# Patient Record
Sex: Female | Born: 1965 | Race: White | Hispanic: No | Marital: Married | Smoking: Former smoker
Health system: Southern US, Community
[De-identification: ages and names within clinical notes are randomized; demographics above are authoritative.]

## PROBLEM LIST (undated history)

## (undated) DIAGNOSIS — E785 Hyperlipidemia, unspecified: Secondary | ICD-10-CM

## (undated) DIAGNOSIS — F32A Depression, unspecified: Secondary | ICD-10-CM

## (undated) DIAGNOSIS — E119 Type 2 diabetes mellitus without complications: Secondary | ICD-10-CM

## (undated) DIAGNOSIS — F329 Major depressive disorder, single episode, unspecified: Secondary | ICD-10-CM

## (undated) HISTORY — DX: Depression, unspecified: F32.A

## (undated) HISTORY — DX: Hyperlipidemia, unspecified: E78.5

## (undated) HISTORY — DX: Major depressive disorder, single episode, unspecified: F32.9

---

## 1989-02-09 HISTORY — PX: OOPHORECTOMY: SHX86

## 1990-01-09 HISTORY — PX: LAPAROSCOPY: SHX197

## 1996-12-10 HISTORY — PX: TUBAL LIGATION: SHX77

## 2001-04-22 ENCOUNTER — Ambulatory Visit (HOSPITAL_COMMUNITY): Admission: RE | Admit: 2001-04-22 | Discharge: 2001-04-22 | Payer: Self-pay | Admitting: Family Medicine

## 2001-04-22 ENCOUNTER — Encounter: Payer: Self-pay | Admitting: Family Medicine

## 2006-03-01 ENCOUNTER — Other Ambulatory Visit: Admission: RE | Admit: 2006-03-01 | Discharge: 2006-03-01 | Payer: Self-pay | Admitting: Gynecology

## 2006-03-05 ENCOUNTER — Ambulatory Visit (HOSPITAL_COMMUNITY): Admission: RE | Admit: 2006-03-05 | Discharge: 2006-03-05 | Payer: Self-pay | Admitting: Family Medicine

## 2006-03-19 ENCOUNTER — Ambulatory Visit (HOSPITAL_COMMUNITY): Admission: RE | Admit: 2006-03-19 | Discharge: 2006-03-19 | Payer: Self-pay | Admitting: Gynecology

## 2006-11-23 ENCOUNTER — Other Ambulatory Visit: Admission: RE | Admit: 2006-11-23 | Discharge: 2006-11-23 | Payer: Self-pay | Admitting: Gynecology

## 2007-04-21 ENCOUNTER — Other Ambulatory Visit: Admission: RE | Admit: 2007-04-21 | Discharge: 2007-04-21 | Payer: Self-pay | Admitting: Gynecology

## 2007-05-26 ENCOUNTER — Encounter: Admission: RE | Admit: 2007-05-26 | Discharge: 2007-05-26 | Payer: Self-pay | Admitting: Gynecology

## 2007-05-31 ENCOUNTER — Ambulatory Visit (HOSPITAL_COMMUNITY): Admission: RE | Admit: 2007-05-31 | Discharge: 2007-05-31 | Payer: Self-pay | Admitting: Family Medicine

## 2007-06-10 HISTORY — PX: VULVA SURGERY: SHX837

## 2011-06-26 ENCOUNTER — Other Ambulatory Visit: Payer: Self-pay | Admitting: Gynecology

## 2011-06-26 DIAGNOSIS — R928 Other abnormal and inconclusive findings on diagnostic imaging of breast: Secondary | ICD-10-CM

## 2011-07-03 ENCOUNTER — Ambulatory Visit
Admission: RE | Admit: 2011-07-03 | Discharge: 2011-07-03 | Disposition: A | Discharge status: Home / Self Care | Payer: 59 | Source: Ambulatory Visit | Attending: Gynecology | Admitting: Gynecology

## 2011-07-03 ENCOUNTER — Other Ambulatory Visit: Payer: Self-pay | Admitting: Gynecology

## 2011-07-03 DIAGNOSIS — R928 Other abnormal and inconclusive findings on diagnostic imaging of breast: Secondary | ICD-10-CM

## 2011-07-03 DIAGNOSIS — N63 Unspecified lump in unspecified breast: Secondary | ICD-10-CM

## 2011-07-07 ENCOUNTER — Ambulatory Visit
Admission: RE | Admit: 2011-07-07 | Discharge: 2011-07-07 | Disposition: A | Discharge status: Home / Self Care | Payer: 59 | Source: Ambulatory Visit | Attending: Gynecology | Admitting: Gynecology

## 2011-07-07 ENCOUNTER — Other Ambulatory Visit: Payer: Self-pay | Admitting: Gynecology

## 2011-07-07 DIAGNOSIS — N63 Unspecified lump in unspecified breast: Secondary | ICD-10-CM

## 2013-05-11 ENCOUNTER — Other Ambulatory Visit: Payer: Self-pay | Admitting: Obstetrics and Gynecology

## 2013-05-16 ENCOUNTER — Other Ambulatory Visit (HOSPITAL_COMMUNITY)
Admission: RE | Admit: 2013-05-16 | Discharge: 2013-05-16 | Disposition: A | Discharge status: Home / Self Care | Payer: 59 | Source: Ambulatory Visit | Attending: Obstetrics and Gynecology | Admitting: Obstetrics and Gynecology

## 2013-05-16 ENCOUNTER — Encounter: Payer: Self-pay | Admitting: Obstetrics and Gynecology

## 2013-05-16 ENCOUNTER — Ambulatory Visit (INDEPENDENT_AMBULATORY_CARE_PROVIDER_SITE_OTHER): Payer: 59 | Admitting: Obstetrics and Gynecology

## 2013-05-16 VITALS — BP 132/78 | Ht 60.2 in | Wt 122.8 lb

## 2013-05-16 DIAGNOSIS — D071 Carcinoma in situ of vulva: Secondary | ICD-10-CM | POA: Insufficient documentation

## 2013-05-16 DIAGNOSIS — Z1212 Encounter for screening for malignant neoplasm of rectum: Secondary | ICD-10-CM

## 2013-05-16 DIAGNOSIS — Z1151 Encounter for screening for human papillomavirus (HPV): Secondary | ICD-10-CM | POA: Insufficient documentation

## 2013-05-16 DIAGNOSIS — Z01419 Encounter for gynecological examination (general) (routine) without abnormal findings: Secondary | ICD-10-CM | POA: Insufficient documentation

## 2013-05-16 DIAGNOSIS — N926 Irregular menstruation, unspecified: Secondary | ICD-10-CM

## 2013-05-16 LAB — HEMOCCULT GUIAC POC 1CARD (OFFICE): Fecal Occult Blood, POC: NEGATIVE

## 2013-05-16 NOTE — Patient Instructions (Signed)
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

## 2013-05-17 NOTE — Progress Notes (Signed)
Patient ID: Crystal Jensen, female   DOB: 05-22-1965, 48 y.o.   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 Pt to take photos of vulva for personal records Plan:  1. pap smear done, next pap due 1 yr   2. return annually or prn consider colposcopy vulva at return 3. Topical Mycolog for current minor irritation 3    Annual mammogram advised Subjective:  Crystal Jensen is a 48 y.o. female No obstetric history on file. who presents for annual exam. Patient's last menstrual period was 05/01/2013. 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  Review of Systems Constitutional: positive for none Gastrointestinal: negative Genitourinary: Hx VIN, unsure of severity, treated, now feels normal and no lesions felt by pt   Objective:  BP 132/78  Ht 5' 0.2" (1.529 m)  Wt 122 lb 12.8 oz (55.702 kg)  BMI 23.83 kg/m2  LMP 05/01/2013   BMI: Body mass index is 23.83 kg/(m^2).  General Appearance: Alert, appropriate appearance for age. No acute distress HEENT: Grossly normal Neck / Thyroid:  Cardiovascular: RRR; normal S1, S2, no murmur Lungs: CTA bilaterally Back: No CVAT Breast Exam: No dimpling, nipple retraction or discharge. No masses or nodes. and No masses or nodes.No dimpling, nipple retraction or discharge. Gastrointestinal: Soft, non-tender, no masses or organomegaly Pelvic Exam: External genitalia: normal general appearance and variable pigmentation of post fourchette, no hyperkeratosis , no plaque, Pt agrees to begin tokeep personal photos, declines photo in EPIC at this time (  not secure ), Vaginal: normal mucosa without prolapse or lesions Cervix: normal appearance Adnexa: normal bimanual exam Uterus: normal single, nontender Rectal: good sphincter tone and guaiac negative Rectovaginal: not indicated and normal rectal, no masses Lymphatic Exam: Non-palpable nodes in neck, clavicular, axillary, or inguinal regions Skin: no rash or abnormalities Neurologic: Normal gait and speech, no tremor  Psychiatric: Alert and oriented, appropriate affect.  Urinalysis:Not done  Christin BachJohn Evoleht Hovatter. MD Pgr [phone] 6:09 AM

## 2014-08-21 ENCOUNTER — Other Ambulatory Visit (HOSPITAL_COMMUNITY): Payer: Self-pay | Admitting: Family Medicine

## 2014-08-21 DIAGNOSIS — M5412 Radiculopathy, cervical region: Secondary | ICD-10-CM

## 2014-08-31 ENCOUNTER — Other Ambulatory Visit (HOSPITAL_COMMUNITY): Payer: Self-pay

## 2016-04-04 ENCOUNTER — Encounter (HOSPITAL_COMMUNITY): Payer: Self-pay

## 2016-04-04 ENCOUNTER — Inpatient Hospital Stay (HOSPITAL_COMMUNITY): Payer: 59 | Admitting: Anesthesiology

## 2016-04-04 ENCOUNTER — Inpatient Hospital Stay
Admission: EM | Admit: 2016-04-04 | Payer: Self-pay | Source: Other Acute Inpatient Hospital | Admitting: Neurological Surgery

## 2016-04-04 ENCOUNTER — Inpatient Hospital Stay (HOSPITAL_COMMUNITY): Payer: 59

## 2016-04-04 ENCOUNTER — Emergency Department (HOSPITAL_COMMUNITY): Payer: 59

## 2016-04-04 ENCOUNTER — Encounter (HOSPITAL_COMMUNITY): Admission: EM | Disposition: E | Payer: Self-pay | Source: Home / Self Care | Attending: Neurological Surgery

## 2016-04-04 ENCOUNTER — Inpatient Hospital Stay (HOSPITAL_COMMUNITY)
Admission: EM | Admit: 2016-04-04 | Discharge: 2016-04-09 | DRG: 023 | Disposition: E | Payer: 59 | Attending: Neurological Surgery | Admitting: Neurological Surgery

## 2016-04-04 DIAGNOSIS — Z882 Allergy status to sulfonamides status: Secondary | ICD-10-CM

## 2016-04-04 DIAGNOSIS — E876 Hypokalemia: Secondary | ICD-10-CM | POA: Diagnosis not present

## 2016-04-04 DIAGNOSIS — Z87891 Personal history of nicotine dependence: Secondary | ICD-10-CM | POA: Diagnosis not present

## 2016-04-04 DIAGNOSIS — J9601 Acute respiratory failure with hypoxia: Secondary | ICD-10-CM

## 2016-04-04 DIAGNOSIS — Z6825 Body mass index (BMI) 25.0-25.9, adult: Secondary | ICD-10-CM

## 2016-04-04 DIAGNOSIS — I619 Nontraumatic intracerebral hemorrhage, unspecified: Secondary | ICD-10-CM

## 2016-04-04 DIAGNOSIS — Z515 Encounter for palliative care: Secondary | ICD-10-CM | POA: Diagnosis not present

## 2016-04-04 DIAGNOSIS — R001 Bradycardia, unspecified: Secondary | ICD-10-CM | POA: Diagnosis not present

## 2016-04-04 DIAGNOSIS — J9600 Acute respiratory failure, unspecified whether with hypoxia or hypercapnia: Secondary | ICD-10-CM | POA: Diagnosis not present

## 2016-04-04 DIAGNOSIS — R4020 Unspecified coma: Secondary | ICD-10-CM | POA: Diagnosis not present

## 2016-04-04 DIAGNOSIS — I10 Essential (primary) hypertension: Secondary | ICD-10-CM | POA: Diagnosis present

## 2016-04-04 DIAGNOSIS — J690 Pneumonitis due to inhalation of food and vomit: Secondary | ICD-10-CM | POA: Diagnosis not present

## 2016-04-04 DIAGNOSIS — I615 Nontraumatic intracerebral hemorrhage, intraventricular: Secondary | ICD-10-CM | POA: Diagnosis present

## 2016-04-04 DIAGNOSIS — F329 Major depressive disorder, single episode, unspecified: Secondary | ICD-10-CM | POA: Diagnosis present

## 2016-04-04 DIAGNOSIS — Z4659 Encounter for fitting and adjustment of other gastrointestinal appliance and device: Secondary | ICD-10-CM

## 2016-04-04 DIAGNOSIS — E785 Hyperlipidemia, unspecified: Secondary | ICD-10-CM | POA: Diagnosis present

## 2016-04-04 DIAGNOSIS — E669 Obesity, unspecified: Secondary | ICD-10-CM | POA: Diagnosis present

## 2016-04-04 DIAGNOSIS — G935 Compression of brain: Secondary | ICD-10-CM | POA: Diagnosis present

## 2016-04-04 DIAGNOSIS — E1165 Type 2 diabetes mellitus with hyperglycemia: Secondary | ICD-10-CM | POA: Diagnosis present

## 2016-04-04 DIAGNOSIS — Z01818 Encounter for other preprocedural examination: Secondary | ICD-10-CM

## 2016-04-04 DIAGNOSIS — I614 Nontraumatic intracerebral hemorrhage in cerebellum: Secondary | ICD-10-CM | POA: Diagnosis not present

## 2016-04-04 DIAGNOSIS — I959 Hypotension, unspecified: Secondary | ICD-10-CM | POA: Diagnosis not present

## 2016-04-04 DIAGNOSIS — Z66 Do not resuscitate: Secondary | ICD-10-CM | POA: Diagnosis not present

## 2016-04-04 DIAGNOSIS — I616 Nontraumatic intracerebral hemorrhage, multiple localized: Secondary | ICD-10-CM | POA: Diagnosis present

## 2016-04-04 DIAGNOSIS — Z978 Presence of other specified devices: Secondary | ICD-10-CM

## 2016-04-04 DIAGNOSIS — I639 Cerebral infarction, unspecified: Secondary | ICD-10-CM | POA: Diagnosis present

## 2016-04-04 DIAGNOSIS — R51 Headache: Secondary | ICD-10-CM | POA: Diagnosis present

## 2016-04-04 HISTORY — PX: CRANIOTOMY: SHX93

## 2016-04-04 HISTORY — DX: Type 2 diabetes mellitus without complications: E11.9

## 2016-04-04 LAB — BLOOD GAS, ARTERIAL
ACID-BASE DEFICIT: 6.5 mmol/L — AB (ref 0.0–2.0)
BICARBONATE: 19.1 mmol/L — AB (ref 20.0–28.0)
Drawn by: 24485
FIO2: 100
MECHVT: 420 mL
O2 Saturation: 99.2 %
PATIENT TEMPERATURE: 96.8
PEEP/CPAP: 5 cmH2O
PH ART: 7.289 — AB (ref 7.350–7.450)
PO2 ART: 310 mmHg — AB (ref 83.0–108.0)
RATE: 16 resp/min
pCO2 arterial: 40.5 mmHg (ref 32.0–48.0)

## 2016-04-04 LAB — POCT I-STAT 7, (LYTES, BLD GAS, ICA,H+H)
ACID-BASE DEFICIT: 8 mmol/L — AB (ref 0.0–2.0)
Acid-base deficit: 5 mmol/L — ABNORMAL HIGH (ref 0.0–2.0)
Bicarbonate: 20.2 mmol/L (ref 20.0–28.0)
Bicarbonate: 16.4 mmol/L — ABNORMAL LOW (ref 20.0–28.0)
Calcium, Ion: 1.06 mmol/L — ABNORMAL LOW (ref 1.15–1.40)
Calcium, Ion: 1.04 mmol/L — ABNORMAL LOW (ref 1.15–1.40)
HEMATOCRIT: 24 % — AB (ref 36.0–46.0)
HEMATOCRIT: 29 % — AB (ref 36.0–46.0)
HEMOGLOBIN: 9.9 g/dL — AB (ref 12.0–15.0)
Hemoglobin: 8.2 g/dL — ABNORMAL LOW (ref 12.0–15.0)
O2 SAT: 100 %
O2 Saturation: 100 %
PCO2 ART: 35 mmHg (ref 32.0–48.0)
PO2 ART: 467 mmHg — AB (ref 83.0–108.0)
POTASSIUM: 3.7 mmol/L (ref 3.5–5.1)
POTASSIUM: 4.1 mmol/L (ref 3.5–5.1)
Sodium: 144 mmol/L (ref 135–145)
Sodium: 142 mmol/L (ref 135–145)
TCO2: 21 mmol/L (ref 0–100)
TCO2: 17 mmol/L (ref 0–100)
pCO2 arterial: 29.6 mmHg — ABNORMAL LOW (ref 32.0–48.0)
pH, Arterial: 7.369 (ref 7.350–7.450)
pH, Arterial: 7.352 (ref 7.350–7.450)
pO2, Arterial: 245 mmHg — ABNORMAL HIGH (ref 83.0–108.0)

## 2016-04-04 LAB — URINALYSIS, ROUTINE W REFLEX MICROSCOPIC
BACTERIA UA: NONE SEEN
Bilirubin Urine: NEGATIVE
Glucose, UA: NEGATIVE mg/dL
Ketones, ur: NEGATIVE mg/dL
Leukocytes, UA: NEGATIVE
Nitrite: NEGATIVE
PROTEIN: 30 mg/dL — AB
Specific Gravity, Urine: 1.015 (ref 1.005–1.030)
pH: 7 (ref 5.0–8.0)

## 2016-04-04 LAB — DIFFERENTIAL
BASOS PCT: 0 %
Basophils Absolute: 0.1 10*3/uL (ref 0.0–0.1)
EOS PCT: 1 %
Eosinophils Absolute: 0.1 10*3/uL (ref 0.0–0.7)
Lymphocytes Relative: 32 %
Lymphs Abs: 4.6 10*3/uL — ABNORMAL HIGH (ref 0.7–4.0)
MONO ABS: 1.2 10*3/uL — AB (ref 0.1–1.0)
Monocytes Relative: 8 %
NEUTROS PCT: 59 %
Neutro Abs: 8.2 10*3/uL — ABNORMAL HIGH (ref 1.7–7.7)

## 2016-04-04 LAB — I-STAT CHEM 8, ED
BUN: 9 mg/dL (ref 6–20)
CALCIUM ION: 1.2 mmol/L (ref 1.15–1.40)
CHLORIDE: 108 mmol/L (ref 101–111)
Creatinine, Ser: 0.4 mg/dL — ABNORMAL LOW (ref 0.44–1.00)
Glucose, Bld: 145 mg/dL — ABNORMAL HIGH (ref 65–99)
HEMATOCRIT: 36 % (ref 36.0–46.0)
Hemoglobin: 12.2 g/dL (ref 12.0–15.0)
POTASSIUM: 3.5 mmol/L (ref 3.5–5.1)
SODIUM: 142 mmol/L (ref 135–145)
TCO2: 20 mmol/L (ref 0–100)

## 2016-04-04 LAB — PREPARE RBC (CROSSMATCH)

## 2016-04-04 LAB — COMPREHENSIVE METABOLIC PANEL
ALT: 17 U/L (ref 14–54)
ANION GAP: 10 (ref 5–15)
AST: 23 U/L (ref 15–41)
Albumin: 4 g/dL (ref 3.5–5.0)
Alkaline Phosphatase: 86 U/L (ref 38–126)
BUN: 10 mg/dL (ref 6–20)
CHLORIDE: 109 mmol/L (ref 101–111)
CO2: 20 mmol/L — AB (ref 22–32)
Calcium: 9.8 mg/dL (ref 8.9–10.3)
Creatinine, Ser: 0.56 mg/dL (ref 0.44–1.00)
GFR calc non Af Amer: >60 mL/min (ref >60)
Glucose, Bld: 146 mg/dL — ABNORMAL HIGH (ref 65–99)
Potassium: 3.4 mmol/L — ABNORMAL LOW (ref 3.5–5.1)
SODIUM: 139 mmol/L (ref 135–145)
Total Bilirubin: 0.3 mg/dL (ref 0.3–1.2)
Total Protein: 7.5 g/dL (ref 6.5–8.1)

## 2016-04-04 LAB — PROTIME-INR
INR: 0.88
PROTHROMBIN TIME: 11.9 s (ref 11.4–15.2)

## 2016-04-04 LAB — CBC
HCT: 36.8 % (ref 36.0–46.0)
Hemoglobin: 12.6 g/dL (ref 12.0–15.0)
MCH: 28.8 pg (ref 26.0–34.0)
MCHC: 34.2 g/dL (ref 30.0–36.0)
MCV: 84.2 fL (ref 78.0–100.0)
PLATELETS: 411 10*3/uL — AB (ref 150–400)
RBC: 4.37 MIL/uL (ref 3.87–5.11)
RDW: 13.7 % (ref 11.5–15.5)
WBC: 14.2 10*3/uL — AB (ref 4.0–10.5)

## 2016-04-04 LAB — MRSA PCR SCREENING: MRSA BY PCR: NEGATIVE

## 2016-04-04 LAB — RAPID URINE DRUG SCREEN, HOSP PERFORMED
AMPHETAMINES: NOT DETECTED
BENZODIAZEPINES: NOT DETECTED
Barbiturates: NOT DETECTED
Cocaine: NOT DETECTED
OPIATES: POSITIVE — AB
Tetrahydrocannabinol: NOT DETECTED

## 2016-04-04 LAB — ETHANOL

## 2016-04-04 LAB — CBG MONITORING, ED: GLUCOSE-CAPILLARY: 144 mg/dL — AB (ref 65–99)

## 2016-04-04 LAB — I-STAT TROPONIN, ED: Troponin i, poc: 0 ng/mL (ref 0.00–0.08)

## 2016-04-04 LAB — POCT I-STAT 4, (NA,K, GLUC, HGB,HCT)
Glucose, Bld: 123 mg/dL — ABNORMAL HIGH (ref 65–99)
HCT: 27 % — ABNORMAL LOW (ref 36.0–46.0)
Hemoglobin: 9.2 g/dL — ABNORMAL LOW (ref 12.0–15.0)
POTASSIUM: 4.1 mmol/L (ref 3.5–5.1)
SODIUM: 141 mmol/L (ref 135–145)

## 2016-04-04 LAB — GLUCOSE, CAPILLARY
GLUCOSE-CAPILLARY: 146 mg/dL — AB (ref 65–99)
Glucose-Capillary: 129 mg/dL — ABNORMAL HIGH (ref 65–99)

## 2016-04-04 LAB — ABO/RH: ABO/RH(D): O POS

## 2016-04-04 LAB — APTT: aPTT: 34 seconds (ref 24–36)

## 2016-04-04 SURGERY — CRANIOTOMY HEMATOMA EVACUATION SUBDURAL
Anesthesia: General | Site: Head | Laterality: Left

## 2016-04-04 MED ORDER — LABETALOL HCL 5 MG/ML IV SOLN
10.0000 mg | Freq: Once | INTRAVENOUS | Status: AC
  Administered 2016-04-04: 10 mg via INTRAVENOUS
  Filled 2016-04-04: qty 4

## 2016-04-04 MED ORDER — EPHEDRINE 5 MG/ML INJ
INTRAVENOUS | Status: AC
  Filled 2016-04-04: qty 10

## 2016-04-04 MED ORDER — SENNA 8.6 MG PO TABS
1.0000 | ORAL_TABLET | Freq: Two times a day (BID) | ORAL | Status: DC
  Administered 2016-04-04: 8.6 mg via ORAL
  Filled 2016-04-04 (×2): qty 1

## 2016-04-04 MED ORDER — DEXAMETHASONE SODIUM PHOSPHATE 10 MG/ML IJ SOLN
INTRAMUSCULAR | Status: DC | PRN
  Administered 2016-04-04: 10 mg via INTRAVENOUS

## 2016-04-04 MED ORDER — SODIUM CHLORIDE 0.9 % IV SOLN
250.0000 mL | INTRAVENOUS | Status: DC | PRN
Start: 2016-04-04 — End: 2016-04-06
  Administered 2016-04-04: 14:00:00 via INTRAVENOUS

## 2016-04-04 MED ORDER — CEFAZOLIN SODIUM-DEXTROSE 2-4 GM/100ML-% IV SOLN
2.0000 g | Freq: Three times a day (TID) | INTRAVENOUS | Status: DC
  Administered 2016-04-04 – 2016-04-05 (×2): 2 g via INTRAVENOUS
  Filled 2016-04-04 (×3): qty 100

## 2016-04-04 MED ORDER — ONDANSETRON HCL 4 MG/2ML IJ SOLN
4.0000 mg | Freq: Once | INTRAMUSCULAR | Status: AC
  Administered 2016-04-04: 4 mg via INTRAVENOUS
  Filled 2016-04-04: qty 2

## 2016-04-04 MED ORDER — SODIUM CHLORIDE 0.9 % IV SOLN
Freq: Once | INTRAVENOUS | Status: DC
Start: 2016-04-04 — End: 2016-04-05

## 2016-04-04 MED ORDER — ROCURONIUM BROMIDE 100 MG/10ML IV SOLN
INTRAVENOUS | Status: DC | PRN
  Administered 2016-04-04 (×2): 50 mg via INTRAVENOUS

## 2016-04-04 MED ORDER — DOCUSATE SODIUM 100 MG PO CAPS
100.0000 mg | ORAL_CAPSULE | Freq: Two times a day (BID) | ORAL | Status: DC
  Filled 2016-04-04: qty 1

## 2016-04-04 MED ORDER — ONDANSETRON HCL 4 MG/2ML IJ SOLN: 4.0000 mg | Freq: Four times a day (QID) | INTRAMUSCULAR | Status: DC | PRN

## 2016-04-04 MED ORDER — ROCURONIUM BROMIDE 50 MG/5ML IV SOSY
PREFILLED_SYRINGE | INTRAVENOUS | Status: AC
  Filled 2016-04-04: qty 20

## 2016-04-04 MED ORDER — PROPOFOL 10 MG/ML IV BOLUS
INTRAVENOUS | Status: DC | PRN
  Administered 2016-04-04: 60 mg via INTRAVENOUS

## 2016-04-04 MED ORDER — LIDOCAINE-EPINEPHRINE (PF) 2 %-1:200000 IJ SOLN
INTRAMUSCULAR | Status: AC
  Filled 2016-04-04: qty 20

## 2016-04-04 MED ORDER — VANCOMYCIN HCL 1000 MG IV SOLR
INTRAVENOUS | Status: AC
  Filled 2016-04-04: qty 1000

## 2016-04-04 MED ORDER — CHLORHEXIDINE GLUCONATE 0.12% ORAL RINSE (MEDLINE KIT)
15.0000 mL | Freq: Two times a day (BID) | OROMUCOSAL | Status: DC
  Administered 2016-04-04 – 2016-04-05 (×3): 15 mL via OROMUCOSAL

## 2016-04-04 MED ORDER — FENOFIBRATE 160 MG PO TABS
160.0000 mg | ORAL_TABLET | Freq: Every day | ORAL | Status: DC
  Filled 2016-04-04 (×2): qty 1

## 2016-04-04 MED ORDER — ATROPINE SULFATE 1 MG/10ML IJ SOSY
PREFILLED_SYRINGE | INTRAMUSCULAR | Status: AC
  Filled 2016-04-04: qty 10

## 2016-04-04 MED ORDER — PANTOPRAZOLE SODIUM 40 MG IV SOLR
40.0000 mg | Freq: Every day | INTRAVENOUS | Status: DC
  Administered 2016-04-04: 40 mg via INTRAVENOUS
  Filled 2016-04-04: qty 40

## 2016-04-04 MED ORDER — HEMOSTATIC AGENTS (NO CHARGE) OPTIME
TOPICAL | Status: DC | PRN
  Administered 2016-04-04 (×2): 1 via TOPICAL

## 2016-04-04 MED ORDER — FLEET ENEMA 7-19 GM/118ML RE ENEM: 1.0000 | ENEMA | Freq: Once | RECTAL | Status: DC | PRN

## 2016-04-04 MED ORDER — GELATIN ABSORBABLE MT POWD
OROMUCOSAL | Status: DC | PRN
  Administered 2016-04-04 (×3): 5 mL via TOPICAL

## 2016-04-04 MED ORDER — INSULIN ASPART 100 UNIT/ML ~~LOC~~ SOLN
0.0000 [IU] | SUBCUTANEOUS | Status: DC
  Administered 2016-04-04 – 2016-04-05 (×3): 2 [IU] via SUBCUTANEOUS
  Administered 2016-04-05: 3 [IU] via SUBCUTANEOUS
  Administered 2016-04-05: 2 [IU] via SUBCUTANEOUS

## 2016-04-04 MED ORDER — HYDRALAZINE HCL 20 MG/ML IJ SOLN: 5.0000 mg | INTRAMUSCULAR | Status: DC | PRN

## 2016-04-04 MED ORDER — MANNITOL 25 % IV SOLN: 25.0000 g | Freq: Once | INTRAVENOUS | Status: AC

## 2016-04-04 MED ORDER — BUPIVACAINE-EPINEPHRINE (PF) 0.5% -1:200000 IJ SOLN
INTRAMUSCULAR | Status: DC | PRN
  Administered 2016-04-04: 13 mL

## 2016-04-04 MED ORDER — EPINEPHRINE PF 1 MG/10ML IJ SOSY
PREFILLED_SYRINGE | INTRAMUSCULAR | Status: AC
  Filled 2016-04-04: qty 10

## 2016-04-04 MED ORDER — ACETAMINOPHEN 325 MG PO TABS
650.0000 mg | ORAL_TABLET | Freq: Four times a day (QID) | ORAL | Status: DC | PRN
Start: 2016-04-04 — End: 2016-04-05

## 2016-04-04 MED ORDER — PHENYLEPHRINE 40 MCG/ML (10ML) SYRINGE FOR IV PUSH (FOR BLOOD PRESSURE SUPPORT)
PREFILLED_SYRINGE | INTRAVENOUS | Status: AC
  Filled 2016-04-04: qty 30

## 2016-04-04 MED ORDER — SUCCINYLCHOLINE CHLORIDE 20 MG/ML IJ SOLN
INTRAMUSCULAR | Status: AC | PRN
  Administered 2016-04-04: 120 mg via INTRAVENOUS

## 2016-04-04 MED ORDER — ONDANSETRON HCL 4 MG PO TABS: 4.0000 mg | ORAL_TABLET | ORAL | Status: DC | PRN

## 2016-04-04 MED ORDER — MANNITOL 20 % IV SOLN
50.0000 g | Status: AC
  Administered 2016-04-04: 250 mL via INTRAVENOUS
  Filled 2016-04-04: qty 250

## 2016-04-04 MED ORDER — ROSUVASTATIN CALCIUM 20 MG PO TABS
20.0000 mg | ORAL_TABLET | Freq: Every day | ORAL | Status: DC
  Administered 2016-04-05: 20 mg via ORAL
  Filled 2016-04-04 (×2): qty 1

## 2016-04-04 MED ORDER — LACTATED RINGERS IV SOLN
INTRAVENOUS | Status: DC | PRN
Start: 2016-04-04 — End: 2016-04-04

## 2016-04-04 MED ORDER — LABETALOL HCL 5 MG/ML IV SOLN: 10.0000 mg | INTRAVENOUS | Status: DC | PRN

## 2016-04-04 MED ORDER — BISACODYL 5 MG PO TBEC: 5.0000 mg | DELAYED_RELEASE_TABLET | Freq: Every day | ORAL | Status: DC | PRN

## 2016-04-04 MED ORDER — THROMBIN 5000 UNITS EX SOLR
CUTANEOUS | Status: AC
  Filled 2016-04-04: qty 15000

## 2016-04-04 MED ORDER — SODIUM CHLORIDE 0.9 % IV SOLN: Freq: Once | INTRAVENOUS | Status: DC

## 2016-04-04 MED ORDER — HYDROMORPHONE HCL 1 MG/ML IJ SOLN
1.0000 mg | Freq: Once | INTRAMUSCULAR | Status: AC
  Administered 2016-04-04: 1 mg via INTRAVENOUS
  Filled 2016-04-04: qty 1

## 2016-04-04 MED ORDER — FAMOTIDINE 40 MG/5ML PO SUSR
20.0000 mg | Freq: Two times a day (BID) | ORAL | Status: DC
  Administered 2016-04-04 – 2016-04-05 (×2): 20 mg
  Filled 2016-04-04 (×2): qty 2.5

## 2016-04-04 MED ORDER — SODIUM CHLORIDE 0.9 % IV BOLUS (SEPSIS): 2000.0000 mL | Freq: Once | INTRAVENOUS | Status: DC

## 2016-04-04 MED ORDER — SODIUM CHLORIDE 0.9 % IV BOLUS (SEPSIS)
1000.0000 mL | Freq: Once | INTRAVENOUS | Status: AC
Start: 2016-04-04 — End: 2016-04-04
  Administered 2016-04-04: 1000 mL via INTRAVENOUS

## 2016-04-04 MED ORDER — BACITRACIN ZINC 500 UNIT/GM EX OINT
TOPICAL_OINTMENT | CUTANEOUS | Status: AC
  Filled 2016-04-04: qty 28.35

## 2016-04-04 MED ORDER — HYDROCODONE-ACETAMINOPHEN 5-325 MG PO TABS: 1.0000 | ORAL_TABLET | ORAL | Status: DC | PRN

## 2016-04-04 MED ORDER — THROMBIN 5000 UNITS EX SOLR
CUTANEOUS | Status: AC
  Filled 2016-04-04: qty 5000

## 2016-04-04 MED ORDER — ALBUTEROL SULFATE (2.5 MG/3ML) 0.083% IN NEBU: 2.5000 mg | INHALATION_SOLUTION | RESPIRATORY_TRACT | Status: DC | PRN

## 2016-04-04 MED ORDER — SODIUM CHLORIDE 0.9 % IV SOLN: INTRAVENOUS | Status: DC

## 2016-04-04 MED ORDER — LABETALOL HCL 5 MG/ML IV SOLN
20.0000 mg | Freq: Once | INTRAVENOUS | Status: AC
  Administered 2016-04-04: 20 mg via INTRAVENOUS

## 2016-04-04 MED ORDER — LIDOCAINE-EPINEPHRINE (PF) 2 %-1:200000 IJ SOLN
INTRAMUSCULAR | Status: DC | PRN
  Administered 2016-04-04: 13 mL

## 2016-04-04 MED ORDER — NICARDIPINE HCL IN NACL 20-0.86 MG/200ML-% IV SOLN
3.0000 mg/h | INTRAVENOUS | Status: DC
  Administered 2016-04-04: 3 mg/h via INTRAVENOUS
  Administered 2016-04-04: 5 mg/h via INTRAVENOUS
  Filled 2016-04-04 (×5): qty 200

## 2016-04-04 MED ORDER — THROMBIN 20000 UNITS EX SOLR
CUTANEOUS | Status: AC
  Filled 2016-04-04: qty 20000

## 2016-04-04 MED ORDER — MANNITOL 25 % IV SOLN
INTRAVENOUS | Status: AC
  Administered 2016-04-04: 25 g
  Filled 2016-04-04: qty 100

## 2016-04-04 MED ORDER — PHENYLEPHRINE HCL 10 MG/ML IJ SOLN
INTRAMUSCULAR | Status: DC | PRN
  Administered 2016-04-04: 30 ug/min via INTRAVENOUS

## 2016-04-04 MED ORDER — PROMETHAZINE HCL 25 MG PO TABS: 12.5000 mg | ORAL_TABLET | ORAL | Status: DC | PRN

## 2016-04-04 MED ORDER — HYDROMORPHONE HCL 1 MG/ML IJ SOLN: 0.5000 mg | INTRAMUSCULAR | Status: DC | PRN

## 2016-04-04 MED ORDER — NALOXONE HCL 0.4 MG/ML IJ SOLN: 0.0800 mg | INTRAMUSCULAR | Status: DC | PRN

## 2016-04-04 MED ORDER — NALOXONE HCL 0.4 MG/ML IJ SOLN
INTRAMUSCULAR | Status: AC
  Administered 2016-04-04: 0.4 mg via INTRAVENOUS
  Filled 2016-04-04: qty 1

## 2016-04-04 MED ORDER — THROMBIN 20000 UNITS EX SOLR
CUTANEOUS | Status: DC | PRN
  Administered 2016-04-04: 16:00:00 via TOPICAL

## 2016-04-04 MED ORDER — NALOXONE HCL 0.4 MG/ML IJ SOLN
0.4000 mg | Freq: Once | INTRAMUSCULAR | Status: AC
  Administered 2016-04-04: 0.4 mg via INTRAVENOUS

## 2016-04-04 MED ORDER — BUPIVACAINE-EPINEPHRINE (PF) 0.5% -1:200000 IJ SOLN
INTRAMUSCULAR | Status: AC
  Filled 2016-04-04: qty 30

## 2016-04-04 MED ORDER — ETOMIDATE 2 MG/ML IV SOLN
INTRAVENOUS | Status: AC | PRN
  Administered 2016-04-04: 20 mg via INTRAVENOUS

## 2016-04-04 MED ORDER — ONDANSETRON HCL 4 MG/2ML IJ SOLN: 4.0000 mg | INTRAMUSCULAR | Status: DC | PRN

## 2016-04-04 MED ORDER — ORAL CARE MOUTH RINSE
15.0000 mL | OROMUCOSAL | Status: DC
  Administered 2016-04-04 – 2016-04-05 (×9): 15 mL via OROMUCOSAL

## 2016-04-04 MED ORDER — 0.9 % SODIUM CHLORIDE (POUR BTL) OPTIME
TOPICAL | Status: DC | PRN
  Administered 2016-04-04 (×2): 1000 mL

## 2016-04-04 MED ORDER — SODIUM CHLORIDE 0.9 % IR SOLN
Status: DC | PRN
  Administered 2016-04-04: 16:00:00

## 2016-04-04 MED ORDER — FENTANYL CITRATE (PF) 100 MCG/2ML IJ SOLN
INTRAMUSCULAR | Status: AC
  Filled 2016-04-04: qty 4

## 2016-04-04 MED ORDER — BUPROPION HCL ER (XL) 300 MG PO TB24
300.0000 mg | ORAL_TABLET | Freq: Every day | ORAL | Status: DC
  Filled 2016-04-04 (×2): qty 1

## 2016-04-04 MED ORDER — LABETALOL HCL 5 MG/ML IV SOLN
INTRAVENOUS | Status: AC
  Administered 2016-04-04: 20 mg via INTRAVENOUS
  Filled 2016-04-04: qty 4

## 2016-04-04 MED ORDER — CEFAZOLIN SODIUM 1 G IJ SOLR
INTRAMUSCULAR | Status: DC | PRN
  Administered 2016-04-04: 2 g via INTRAMUSCULAR

## 2016-04-04 SURGICAL SUPPLY — 77 items
BENZOIN TINCTURE PRP APPL 2/3 (GAUZE/BANDAGES/DRESSINGS) IMPLANT
BIT DRILL TAPERED 10 (BIT) ×2 IMPLANT
BIT DRILL TAPERED 10MM (BIT) ×1
BLADE CLIPPER SURG (BLADE) ×3 IMPLANT
BLADE ULTRA TIP 2M (BLADE) ×3 IMPLANT
BNDG GAUZE ELAST 4 BULKY (GAUZE/BANDAGES/DRESSINGS) IMPLANT
BUR ACORN 6.0 PRECISION (BURR) ×2 IMPLANT
BUR ACORN 6.0MM PRECISION (BURR) ×1
BUR MATCHSTICK NEURO 3.0 LAGG (BURR) ×3 IMPLANT
BUR SPIRAL ROUTER 2.3 (BUR) ×2 IMPLANT
BUR SPIRAL ROUTER 2.3MM (BUR) ×1
CANISTER SUCT 3000ML PPV (MISCELLANEOUS) ×3 IMPLANT
CARTRIDGE OIL MAESTRO DRILL (MISCELLANEOUS) ×1 IMPLANT
CATH ROBINSON RED A/P 14FR (CATHETERS) IMPLANT
CHLORAPREP W/TINT 26ML (MISCELLANEOUS) ×3 IMPLANT
CLIP TI MEDIUM 6 (CLIP) IMPLANT
DIFFUSER DRILL AIR PNEUMATIC (MISCELLANEOUS) ×3 IMPLANT
DRAPE NEUROLOGICAL W/INCISE (DRAPES) ×3 IMPLANT
DRAPE SHEET LG 3/4 BI-LAMINATE (DRAPES) ×6 IMPLANT
DRAPE WARM FLUID 44X44 (DRAPE) ×3 IMPLANT
DRSG MEPILEX BORDER 4X8 (GAUZE/BANDAGES/DRESSINGS) ×3 IMPLANT
DURAMATRIX ONLAY 2X2 (Neuro Prosthesis/Implant) ×6 IMPLANT
ELECT REM PT RETURN 9FT ADLT (ELECTROSURGICAL) ×3
ELECTRODE REM PT RTRN 9FT ADLT (ELECTROSURGICAL) ×1 IMPLANT
FORCEPS BIPOLAR SPETZLER 8 1.0 (NEUROSURGERY SUPPLIES) ×3 IMPLANT
GAUZE SPONGE 4X4 12PLY STRL (GAUZE/BANDAGES/DRESSINGS) ×3 IMPLANT
GAUZE SPONGE 4X4 16PLY XRAY LF (GAUZE/BANDAGES/DRESSINGS) IMPLANT
GLOVE BIOGEL PI IND STRL 7.5 (GLOVE) ×2 IMPLANT
GLOVE BIOGEL PI INDICATOR 7.5 (GLOVE) ×4
GLOVE SS BIOGEL STRL SZ 7.5 (GLOVE) ×2 IMPLANT
GLOVE SUPERSENSE BIOGEL SZ 7.5 (GLOVE) ×4
GOWN STRL REUS W/ TWL LRG LVL3 (GOWN DISPOSABLE) ×3 IMPLANT
GOWN STRL REUS W/ TWL XL LVL3 (GOWN DISPOSABLE) IMPLANT
GOWN STRL REUS W/TWL 2XL LVL3 (GOWN DISPOSABLE) IMPLANT
GOWN STRL REUS W/TWL LRG LVL3 (GOWN DISPOSABLE) ×6
GOWN STRL REUS W/TWL XL LVL3 (GOWN DISPOSABLE)
HEMOSTAT POWDER KIT SURGIFOAM (HEMOSTASIS) ×9 IMPLANT
HEMOSTAT SURGICEL 2X14 (HEMOSTASIS) ×3 IMPLANT
KIT BASIN OR (CUSTOM PROCEDURE TRAY) ×3 IMPLANT
KIT ROOM TURNOVER OR (KITS) ×3 IMPLANT
NEEDLE HYPO 21X1.5 SAFETY (NEEDLE) ×3 IMPLANT
NEEDLE HYPO 25X1 1.5 SAFETY (NEEDLE) ×3 IMPLANT
NS IRRIG 1000ML POUR BTL (IV SOLUTION) ×6 IMPLANT
OIL CARTRIDGE MAESTRO DRILL (MISCELLANEOUS) ×3
PACK CRANIOTOMY (CUSTOM PROCEDURE TRAY) ×3 IMPLANT
PATTIES SURGICAL .5 X.5 (GAUZE/BANDAGES/DRESSINGS) IMPLANT
PATTIES SURGICAL .5 X3 (DISPOSABLE) IMPLANT
PATTIES SURGICAL .5X1.5 (GAUZE/BANDAGES/DRESSINGS) IMPLANT
PATTIES SURGICAL 1X1 (DISPOSABLE) IMPLANT
PIN MAYFIELD SKULL DISP (PIN) ×3 IMPLANT
PLATE 1.5  2HOLE LNG NEURO (Plate) ×4 IMPLANT
PLATE 1.5 2HOLE LNG NEURO (Plate) ×2 IMPLANT
SCREW HT X DR EMRG 1.8X3.5MM (Screw) ×6 IMPLANT
SCREW SELF DRILL HT 1.5/4MM (Screw) ×6 IMPLANT
SEALANT ADHERUS EXTEND TIP (MISCELLANEOUS) ×3 IMPLANT
SPONGE NEURO XRAY DETECT 1X3 (DISPOSABLE) IMPLANT
SPONGE SURGIFOAM ABS GEL 100 (HEMOSTASIS) ×3 IMPLANT
STAPLER VISISTAT 35W (STAPLE) ×3 IMPLANT
STOCKINETTE 6  STRL (DRAPES)
STOCKINETTE 6 STRL (DRAPES) IMPLANT
STRIP SURGICAL 1 X 6 IN (GAUZE/BANDAGES/DRESSINGS) IMPLANT
STRIP SURGICAL 1/2 X 6 IN (GAUZE/BANDAGES/DRESSINGS) IMPLANT
STRIP SURGICAL 1/4 X 6 IN (GAUZE/BANDAGES/DRESSINGS) IMPLANT
STRIP SURGICAL 3/4 X 6 IN (GAUZE/BANDAGES/DRESSINGS) IMPLANT
SUT ETHILON 2 0 FS 18 (SUTURE) ×3 IMPLANT
SUT ETHILON 3 0 FSL (SUTURE) IMPLANT
SUT ETHILON 3 0 PS 1 (SUTURE) IMPLANT
SUT NURALON 4 0 TR CR/8 (SUTURE) ×3 IMPLANT
SUT VIC AB 0 CT1 18XCR BRD8 (SUTURE) ×1 IMPLANT
SUT VIC AB 0 CT1 8-18 (SUTURE) ×2
SUT VIC AB 2-0 CT1 18 (SUTURE) ×3 IMPLANT
SYR 30ML LL (SYRINGE) ×6 IMPLANT
TOWEL OR 17X24 6PK STRL BLUE (TOWEL DISPOSABLE) ×6 IMPLANT
TOWEL OR 17X26 10 PK STRL BLUE (TOWEL DISPOSABLE) ×3 IMPLANT
TUBE CONNECTING 12'X1/4 (SUCTIONS) ×1
TUBE CONNECTING 12X1/4 (SUCTIONS) ×2 IMPLANT
WATER STERILE IRR 1000ML POUR (IV SOLUTION) ×3 IMPLANT

## 2016-04-04 NOTE — Anesthesia Postprocedure Evaluation (Signed)
Anesthesia Post Note  Patient: Crystal Jensen  Procedure(s) Performed: Procedure(s) (LRB): SUBOCCIPITAL CRANIOTOMY FOR EVACUATION OF TUMOR (Left)  Patient location during evaluation: SICU Anesthesia Type: General Level of consciousness: sedated Pain management: pain level controlled Vital Signs Assessment: post-procedure vital signs reviewed and stable Respiratory status: patient remains intubated per anesthesia plan Cardiovascular status: stable Anesthetic complications: no       Last Vitals:  Vitals:   2016/09/22 1415 2016/09/22 1716  BP: (!) 87/49 137/73  Pulse:  96  Resp: 17 16  Temp: (!) 35.8 C     Last Pain:  Vitals:   2016/09/22 1149  TempSrc: Rectal  PainSc:                  Danica Camarena

## 2016-04-04 NOTE — Code Documentation (Signed)
Dr. Estell HarpinZammit attempting intubation.  2 attempts unsuccessful.  Sats remain stable with BVM.

## 2016-04-04 NOTE — Procedures (Signed)
This procedure was performed under emergency conditions.  The right frontal clipped of hair. It was sterilely prepped and draped. The site of the procedure was anesthetized with lidocaine with epinephrine. A linear incision was made at Kocher's point. Pericranium was scraped away. A twist drill burr hole was made at Kocher's point. The bone chips were washed away. The dura was sharply incised. An antibiotic-impregnated ventricular catheter was passed to a depth of 7 cm at the skin and there was spinal fluid returned when the stylette was removed. This was tunneled posterior medially through the skin. The catheter was secured at its exit site with a pursestring suture. The incision was closed with staples. The catheter was coiled and secured with staples to form a tension loop. It was then connected to the drainage chamber.

## 2016-04-04 NOTE — Progress Notes (Signed)
SLP Cancellation Note  Patient Details Name: Crystal IdlerMaria Jensen MRN: [id number] DOB: Aug 16, 1965   Cancelled treatment:       Reason Eval/Treat Not Completed: Medical issues which prohibited therapy. Pt intubated on ventilator. SLP will f/u when medically ready.  [name], [place] CF-SLP Speech-Language Pathologist [phone]      [name] 04/02/2016, 12:42 PM

## 2016-04-04 NOTE — Progress Notes (Signed)
Call from ER 0940--code stroke In room at 0944 Out of room [phone] [facility] 09[facility] Called [facility] at [phone] Report called to Dr Estell HarpinZammit by radiologist at 1002 am.

## 2016-04-04 NOTE — ED Notes (Signed)
Checked patients temp rectal to make sure cord was reading correctly. RN aware.

## 2016-04-04 NOTE — ED Provider Notes (Signed)
[facility]-EM[facility]Y DEPT Provider Note   CSN: [id number] Arrival date & time: 03/27/2016  [id number]  By signing my name below, I, Talbert NanPaul Grant, attest that this documentation has been prepared under the direction and in the presence of Bethann BerkshireJoseph Cyree Chuong, MD. Electronically Signed: Talbert NanPaul Grant, Scribe. 03/18/2016. 10:21 AM.   History   Chief Complaint Chief Complaint  Patient presents with  . Code Stroke  . Dizziness    HPI Crystal IdlerMaria Jensen is a 51 y.o. female with h/o hormonal headaches, vertigo, sinus headaches who presents to the Emergency Department complaining of acute onset, waxing and waning, 10/10 severity headache that began a few minutes before arrival to Ed. Pt has associated nausea, dizziness, spinning feeling, and cramping. She describes the pain as located to the back of her head and pt said "all of a sudden with an ear ache and then head ache and dizziness." .   The history is provided by the patient. No language interpreter was used.  Dizziness  Quality:  Vertigo and room spinning Severity:  Severe Onset quality:  Sudden Duration: minutes before arrival to ED. Timing:  Constant Progression:  Waxing and waning Context comment:  Was in shower at onset Relieved by:  Nothing Ineffective treatments:  None tried Associated symptoms: headaches and nausea   Associated symptoms: no chest pain and no diarrhea   Associated symptoms comment:  Ear pain, dizziness. Headaches:    Severity:  Severe   Onset quality:  Sudden   Duration: minutes before arrival to ED.   Timing:  Constant   Progression:  Waxing and waning   Chronicity:  New Nausea:    Severity:  Moderate   Onset quality:  Sudden   Nausea duration: minutes before arrival to ED.   Timing:  Constant   Progression:  Waxing and waning Risk factors: hx of vertigo   Risk factors comment:  H/o sinus headaches, hormonal headaches   Past Medical History:  Diagnosis Date  . Depression   . Diabetes mellitus without complication (HCC)     . Hyperlipidemia     Patient Active Problem List   Diagnosis Date Noted  . Irregular periods/menstrual cycles 05/16/2013  . 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 CESAREAN SECTION  12/1996  . LAPAROSCOPY  01/1990   remove scar tissue  . OOPHORECTOMY  1991   rt  . TUBAL LIGATION  12/1996  . VULVA SURGERY  06/2007   remove precancerous cells    OB History    No data available       Home Medications    Prior to Admission medications   Medication Sig Start Date End Date Taking? Authorizing Provider  buPROPion (WELLBUTRIN XL) 300 MG 24 hr tablet Take 300 mg by mouth daily.    Historical Provider, MD  fenofibrate 160 MG tablet  03/29/13   Historical Provider, MD  rosuvastatin (CRESTOR) 20 MG tablet Take 20 mg by mouth daily.    Historical Provider, MD    Family History Family History  Problem Relation Age of Onset  . COPD Mother   . Heart disease Father   . Diabetes Paternal Grandmother   . Heart disease Paternal Grandfather     Social History Social History  Substance Use Topics  . Smoking status: Former Smoker    Packs/day: 0.50    Years: 22.00    Types: Cigarettes    Quit date: 12/16/2012  . Smokeless tobacco: Never Used  .  Alcohol use No     Allergies   Sulfa antibiotics   Review of Systems Review of Systems  Constitutional: Negative for appetite change and fatigue.  HENT: Positive for ear pain. Negative for congestion, ear discharge and sinus pressure.   Eyes: Negative for discharge.  Respiratory: Negative for cough.   Cardiovascular: Negative for chest pain.  Gastrointestinal: Positive for nausea. Negative for abdominal pain and diarrhea.  Genitourinary: Negative for frequency and hematuria.  Musculoskeletal: Negative for back pain.  Skin: Negative for rash.  Neurological: Positive for dizziness and headaches. Negative for seizures.       Spinning around feeling.   Psychiatric/Behavioral: Negative for hallucinations.     Physical Exam Updated Vital Signs BP 152/84 (BP Location: Right Arm)   Pulse 101   Temp 97.4 F (36.3 C) (Oral)   Resp 16   Ht 5' (1.524 m)   Wt 130 lb (59 kg)   SpO2 97%   BMI 25.39 kg/m   Physical Exam  Constitutional: She is oriented to person, place, and time. She appears well-developed.  HENT:  Head: Normocephalic.  Eyes: Conjunctivae are normal. No scleral icterus.  Gaze deviated to the right.   Neck: Neck supple. No thyromegaly present.  Cardiovascular: Normal rate and regular rhythm.  Exam reveals no gallop and no friction rub.   No murmur heard. Pulmonary/Chest: No stridor. She has no wheezes. She has no rales. She exhibits no tenderness.  Abdominal: She exhibits no distension. There is no tenderness. There is no rebound.  Musculoskeletal: Normal range of motion. She exhibits no edema.  Lymphadenopathy:    She has no cervical adenopathy.  Neurological: She is oriented to person, place, and time. She exhibits normal muscle tone. Coordination normal.  Skin: No rash noted. No erythema.  Skin clammy.  Psychiatric: Her behavior is normal.     ED Treatments / Results   DIAGNOSTIC STUDIES: Oxygen Saturation is 97% on room air, adequate by my interpretation.    COORDINATION OF CARE: 9:42 AM Discussed treatment plan with pt at bedside and pt agreed to plan.  9:50 AM Consult with radiology on Ct.  10:32 AM Consult with neurologist.  CRITICAL CARE Performed by: Talbert Nan Total critical care time: 60 minutes Critical care time was exclusive of separately billable procedures and treating other patients. Critical care was necessary to treat or prevent imminent or life-threatening deterioration. Critical care was time spent personally by me on the following activities: development of treatment plan with patient and/or surrogate as well as nursing, discussions with consultants, evaluation of patient's  response to treatment, examination of patient, obtaining history from patient or surrogate, ordering and performing treatments and interventions, ordering and review of laboratory studies, ordering and review of radiographic studies, pulse oximetry and re-evaluation of patient's condition.   Labs (all labs ordered are listed, but only abnormal results are displayed) Labs Reviewed  CBG MONITORING, ED - Abnormal; Notable for the following:       Result Value   Glucose-Capillary 144 (*)    All other components within normal limits  ETHANOL  PROTIME-INR  APTT  CBC  DIFFERENTIAL  COMPREHENSIVE METABOLIC PANEL  RAPID URINE DRUG SCREEN, HOSP PERFORMED  URINALYSIS, ROUTINE W REFLEX MICROSCOPIC  I-STAT CHEM 8, ED  I-STAT TROPOININ, ED    EKG  EKG Interpretation None       Radiology No results found.  Procedures Procedures (including critical care time)  Medications Ordered in ED Medications  sodium chloride 0.9 % bolus 1,000 mL (  not administered)  ondansetron (ZOFRAN) injection 4 mg (not administered)     Initial Impression / Assessment and Plan / ED Course  I have reviewed the triage vital signs and the nursing notes.  Pertinent labs & imaging results that were available during my care of the patient were reviewed by me and considered in my medical decision making (see chart for details).   patient was intubated with a #7 tube. She was given etomidate and succinylcholine. #3 curved blade was used. Patient tolerated the procedure well. Confirmation of tube placement was done through auscultation and CO2 return    CRITICAL CARE Performed by: Vallen Calabrese L Total critical care time: 65 minutes Critical care time was exclusive of separately billable procedures and treating other patients. Critical care was necessary to treat or prevent imminent or life-threatening deterioration. Critical care was time spent personally by me on the following activities: development of  treatment plan with patient and/or surrogate as well as nursing, discussions with consultants, evaluation of patient's response to treatment, examination of patient, obtaining history from patient or surrogate, ordering and performing treatments and interventions, ordering and review of laboratory studies, ordering and review of radiographic studies, pulse oximetry and re-evaluation of patient's condition.  The patient started to become apneic. She needed to be intubated. Neurosurgery was consult with and accepted the patient to be transferred to [facility]. Dr. Deidre Ala will see the patient.  Critical care and neurology have also been consult. Patient's family was notified of the situation Final Clinical Impressions(s) / ED Diagnoses   Final diagnoses:  None    New Prescriptions New Prescriptions   No medications on file   The chart was scribed for me under my direct supervision.  I personally performed the history, physical, and medical decision making and all procedures in the evaluation of this patient.Bethann Berkshire, MD 2016/04/14 [phone]

## 2016-04-04 NOTE — ED Notes (Signed)
Called SOC. 

## 2016-04-04 NOTE — Op Note (Signed)
03/15/2016  5:05 PM  PATIENT:  Crystal Jensen  51 y.o. female  PRE-OPERATIVE DIAGNOSIS:  Hemorrhagic posterior fossa mass  POST-OPERATIVE DIAGNOSIS:  Same  PROCEDURE:  Suboccipital craniotomy and C1 laminectomy for evacuation of posterior fossa hemorrhagic mass; use of operating microscope  SURGEON:  Hulan SaasBenjamin J. Ditty, MD  ASSISTANTS: Cindra PresumeVincent Costella, PA-C  ANESTHESIA:   General  DRAINS: None   SPECIMEN:  Posterior fossa hemorrhagic mass  INDICATION FOR PROCEDURE: 51 year old woman who is comatose due to a hemorrhagic posterior fossa mass. I recommended the above listed operation. The patient's husband understood the risks, benefits, and alternatives and potential outcomes and wished to proceed.  PROCEDURE DETAILS: After smooth induction of general endotracheal anesthesia the patient was fixated in Mayfield pins and turned prone on chest rolls. The hair of the suboccipital area was clipped and wiped down with alcohol. Lidocaine and Marcaine with epinephrine was injected in the soft tissues of the midline. The patient was then prepped and draped in usual sterile fashion.  A linear incision was made in the midline of the suboccipital area. Dissection was carried down in an avascular plane from the inion to the C2 spinous process. Subperiosteal dissection was performed on the occipital bone and C1 lamina bilaterally. A C1 laminectomy was then performed. 2 bur holes were drilled superiorly on the occipital bone and then a craniotomy was performed using a footplate. The dura was opened over the spinal cord and then extended superiorly. I encountered a hemorrhagic and contused cerebellum and both cerebellar hemispheres. This was herniating through the dural defect. Using suction aspiration I decompressed the hematoma but saved as much as I could to send for permanent specimen. This was sent as posterior fossa hemorrhagic mass.   After decompression of the posterior fossa the operating  microscope was brought into the field to provide light and magnification.  Utilizing microsurgical technique I continued to work around the margins of the hematoma until I encountered normal brain. There was vigorous bleeding at the inferior margin of the hematoma cavity. This was stopped with bipolar cautery. I continued to aspirate hematoma until I encountered the fourth ventricle. Care was taken not to disrupt the dorsal surface of the brain stem. After I was satisfied that there was adequate decompression and no more obvious abnormality I obtained hemostasis. I irrigated vigorously with bacitracin saline. The dura over the spinal cord was closed with interrupted Nurolon sutures. The dural defect over the cerebellum was closed with an inlay and onlay of dural matrix. The dural defect was sealed with adherus.  The craniotomy flap was fixated to the surrounding bone with titanium fixation plates. I irrigated again with bacitracin saline.  The wound was closed in routine anatomic layers with interrupted Vicryl sutures. The skin was closed with a running locked nylon suture. A sterile dressing was applied and the patient was returned to the supine position on the bed and Mayfield pins were removed.  PATIENT DISPOSITION:  ICU - intubated and critically ill.   Delay start of Pharmacological VTE agent (>24hrs) due to surgical blood loss or risk of bleeding:  yes

## 2016-04-04 NOTE — Transfer of Care (Signed)
Immediate Anesthesia Transfer of Care Note  Patient: Crystal Jensen  Procedure(s) Performed: Procedure(s): SUBOCCIPITAL CRANIOTOMY FOR EVACUATION OF TUMOR (Left)  Patient Location: ICU  Anesthesia Type:General  Level of Consciousness: unresponsive and Patient remains intubated per anesthesia plan  Airway & Oxygen Therapy: Patient remains intubated per anesthesia plan and Patient placed on Ventilator (see vital sign flow sheet for setting)  Post-op Assessment: Report given to RN and Post -op Vital signs reviewed and stable  Post vital signs: Reviewed and stable  Last Vitals:  Vitals:   03/30/2016 1415 03/12/2016 1716  BP: (!) 87/49 137/73  Pulse:  96  Resp: 17 16  Temp: (!) 35.8 C     Last Pain:  Vitals:   03/22/2016 1149  TempSrc: Rectal  PainSc:          Complications: No apparent anesthesia complications   Tolerated transport well.  VS remained stable throughout transport.

## 2016-04-04 NOTE — Consult Note (Signed)
PULMONARY / CRITICAL CARE MEDICINE   Name: Crystal Jensen MRN: [id number] DOB: 1965-08-06    ADMISSION DATE:  05-03-2016 CONSULTATION DATE:  03-May-2016  REFERRING MD:  Dr. Bevely Palmer   CHIEF COMPLAINT:  Altered mental status  HISTORY OF PRESENT ILLNESS:  51 y/o F who presented to [facility] on 2/24 with acute onset severe headache, dizziness, ear pain and dizziness.    The patient carries a history of depression, HLD, DM and tobacco abuse (1/2 ppd x22 years).  In the ER she was noted on exam to have a deviated gaze to the right per documentation but was somewhat alert.  Later in the am, she was found unresponsive by her RN with snoring respirations.  She was intubated by the EDP.  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  The patient was transferred to [facility] for urgent neurosurgical evaluation.    On arrival, the patients pupils were dilated and fixed.  She was hypertensive and medicated with labetalol.  Dr. Bevely Palmer placed an EVD at bedside.    PCCM consulted for ICU medical management.    PAST MEDICAL HISTORY :  She  has a past medical history of Depression; Diabetes mellitus without complication (HCC); and Hyperlipidemia.  PAST SURGICAL HISTORY: She  has a past surgical history that includes Oophorectomy (1991); laparoscopy (01/1990); Tubal ligation (12/1996); Cesarean section (12/1996); and Vulva surgery (06/2007).  Allergies  Allergen Reactions  . Sulfa Antibiotics Rash    No current facility-administered medications on file prior to encounter.    Current Outpatient Prescriptions on File Prior to Encounter  Medication Sig  . buPROPion (WELLBUTRIN XL) 300 MG 24 hr tablet Take 300 mg by mouth daily.  . fenofibrate 160 MG tablet   . rosuvastatin (CRESTOR) 20 MG tablet Take 20 mg by mouth daily.    FAMILY HISTORY:   Her indicated that her mother is deceased. She indicated that her father is deceased. She indicated that both of her sisters are alive. She indicated that both of her brothers are alive. She indicated that her maternal grandmother is deceased. She indicated that her maternal grandfather is deceased. She indicated that her paternal grandmother is deceased. She indicated that her paternal grandfather is deceased.    SOCIAL HISTORY: She  reports that she quit smoking about 3 years ago. Her smoking use included Cigarettes. She has a 11.00 pack-year smoking history. She has never used smokeless tobacco. She reports that she uses drugs, including Marijuana. She reports that she does not drink alcohol.  REVIEW OF SYSTEMS:  Unable to complete as patient is altered on mechanical ventilation.   SUBJECTIVE:   VITAL SIGNS: BP 156/88   Pulse 80   Temp (!) 93.7 F (34.3 C) (Rectal)   Resp 16   Ht 5\' 3"  (1.6 m)   Wt 130 lb (59 kg)   SpO2 100%   BMI 23.03 kg/m   HEMODYNAMICS:    VENTILATOR SETTINGS: Vent Mode: PRVC FiO2 (%):  [40 %-100 %] 100 % Set Rate:  [16 bmp-18 bmp] 16 bmp Vt Set:  [450 mL] 450 mL PEEP:  [5 cmH20] 5 cmH20 Plateau Pressure:  [14 cmH20-16 cmH20] 16 cmH20  INTAKE / OUTPUT: No intake/output data recorded.  PHYSICAL EXAMINATION: General:  Adult female on vent Neuro:  No response to verbal stimuli, pupils 7-8 mm, non-reactive, negative corneal response, cough / gag deferred out of concern  for increased ICP HEENT:  MM pink/moist, ETT Cardiovascular:  s1s2 rrr, no m/r/g  Lungs:  Even/non-labored, clear bilaterally, slightly diminished on L Abdomen:  Obese/soft, bsx4 active  Musculoskeletal:  No acute deformities  Skin:  Warm/dry, no edema   LABS:  BMET  Recent Labs Lab 10-28-16 0953 10-28-16 1001  NA 139 142  K 3.4* 3.5  CL 109 108  CO2 20*  --   BUN 10 9  CREATININE 0.56 0.40*  GLUCOSE 146* 145*    Electrolytes  Recent Labs Lab 10-28-16 0953   CALCIUM 9.8    CBC  Recent Labs Lab 10-28-16 0953 10-28-16 1001  WBC 14.2*  --   HGB 12.6 12.2  HCT 36.8 36.0  PLT 411*  --     Coag's  Recent Labs Lab 10-28-16 0953  APTT 34  INR 0.88    Sepsis Markers No results for input(s): LATICACIDVEN, PROCALCITON, O2SATVEN in the last 168 hours.  ABG No results for input(s): PHART, PCO2ART, PO2ART in the last 168 hours.  Liver Enzymes  Recent Labs Lab 10-28-16 0953  AST 23  ALT 17  ALKPHOS 86  BILITOT 0.3  ALBUMIN 4.0    Cardiac Enzymes No results for input(s): TROPONINI, PROBNP in the last 168 hours.  Glucose  Recent Labs Lab 10-28-16 0943  GLUCAP 144*    Imaging Ct Head Wo Contrast  Result Date: March 23, 2016 CLINICAL DATA:  Severe acute headache and dizziness. EXAM: CT HEAD WITHOUT CONTRAST TECHNIQUE: Contiguous axial images were obtained from the base of the skull through the vertex without intravenous contrast. COMPARISON:  None. FINDINGS: Brain: There are multiple areas of hemorrhage within the brain and cerebellum, most with adjacent vasogenic edema. These lesions are noted within both frontal, parietal and occipital lobes. The largest area of hemorrhage measures 3 cm within the cerebellar vermis. There is no evidence of hydrocephalus, extra-axial collection or midline shift. Vascular: No hyperdense vessel or unexpected calcification. Skull: Normal. Negative for fracture or focal lesion. Sinuses/Orbits: No acute finding. Other: None. 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 Largest area of hemorrhage measures 3 cm within the cerebellar vermis. No evidence of midline shift or hydrocephalus. Critical Value/emergent results were called by telephone at the time of interpretation on March 23, 2016 at 10:02 am to Dr. Bethann BerkshireJOSEPH ZAMMIT , who verbally acknowledged these results. Electronically Signed   By: Harmon PierJeffrey  Hu M.D.   On: 0February 12, 2018  10:08     STUDIES:  CT Head 2/24 >> multiple hemorrhagic lesions within both cerebral hemispheres and cerebellum concerning for hemorrhagic metastases, largest area measuring 3cm without evidence of midline shift or hydrocephalus.  CULTURES: HIV 2/24 >>   ANTIBIOTICS:   SIGNIFICANT EVENTS: 2/24  Admitted with acute onset ear pain, dizziness.  CT head > hemorrhagic lesions concerning for mets.  S/p EVD  LINES/TUBES: EVD 2/24 >>   DISCUSSION: 51 y/o F admitted with acute onset ear pain, dizziness, deviated eyes in ER.  Found unresponsive, CT head with multiple hemorrhagic lesions concerning for metastasis.  Tx [facility].  EVD drain placed on arrival.    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  Hypothermia Depression P:   RASS goal: 0 to -1  Serial neuro exams  EVD per NSGY  Defer further neuroimaging to NSGY  Pending OR for craniectomy for evacuation of hemorrhagic cerebellar tumor Hold home wellbutrin   PULMONARY A: Acute Respiratory Failure in setting of ICH Tobacco  Abuse  P:   PRVC 8 cc/kg  Wean PEEP / FiO2 for sats >90% Now CXR to confirm ETT placement  PRN albuterol with smoking hx  ABG now Trend CXR  CARDIOVASCULAR A:  Hypertension - acute, no hx of  Hx HLD  P:  SBP goal <160 Cardene gtt for above ICU monitoring  Hold home cholesterol agents for now  RENAL A:   Hypokalemia  P:   Trend BMP / UOP  Replace electrolytes as indicated   GASTROINTESTINAL A:   No acute issues  P:   NPO  Consider TF in am pending clinical status review  Pepcid BID for SUP  Place OGT  HEMATOLOGIC A:   Mild Leukocytosis - likely stress response  Concern for Underlying Malignancy - see neuro  P:  Trend CBC  No heparin given ICH  SCD's for DVT prophylaxis  Consider CT chest, abd/pelvis once stabilized for assessment of underlying mass / malignancy   INFECTIOUS A:   No acute infectious process P:   Monitor EVD site,  fever curve / WBC trend   ENDOCRINE A:   Hyperglycemia DM II  P:   SSI    FAMILY  - Updates: Family updated at bedside.    - Inter-disciplinary family meet or Palliative Care meeting due by:  Ongoing   CC Time: 30 minutes  Canary Brim, NP-C [facility] Pgr: [phone] or if no answer [phone] 04/23/16, 1:28 PM

## 2016-04-04 NOTE — Progress Notes (Addendum)
PA called. IVC drain at 0cm with around output of 5/hr. To leave at current level overnight.   PA to bedside-decision made to raise drain to 10cm.

## 2016-04-04 NOTE — Anesthesia Preprocedure Evaluation (Signed)
Anesthesia Evaluation  Patient identified by MRN, date of birth, ID band Patient unresponsive    Reviewed: Patient's Chart, lab work & pertinent test results, Unable to perform ROS - Chart review onlyPreop documentation limited or incomplete due to emergent nature of procedure.  Airway Mallampati: Intubated       Dental   Pulmonary former smoker,    breath sounds clear to auscultation       Cardiovascular  Rhythm:Regular     Neuro/Psych PSYCHIATRIC DISORDERS Depression Hemorrhagic conversion of brain mets CVA    GI/Hepatic negative GI ROS, Neg liver ROS,   Endo/Other  diabetes  Renal/GU negative Renal ROS     Musculoskeletal   Abdominal   Peds  Hematology negative hematology ROS (+)   Anesthesia Other Findings   Reproductive/Obstetrics                             Anesthesia Physical Anesthesia Plan  ASA: IV and emergent  Anesthesia Plan: General   Post-op Pain Management:    Induction: Inhalational  Airway Management Planned: Oral ETT  Additional Equipment: Arterial line  Intra-op Plan:   Post-operative Plan: Post-operative intubation/ventilation  Informed Consent:   Only emergency history available  Plan Discussed with: CRNA and Surgeon  Anesthesia Plan Comments:         Anesthesia Quick Evaluation

## 2016-04-04 NOTE — ED Notes (Signed)
Report given to Caleb with [facility]. 

## 2016-04-04 NOTE — H&P (Signed)
CC:  Chief Complaint  Patient presents with  . Code Stroke  . Dizziness    HPI: Crystal Jensen is a 51 y.o. female who presented to [facility] for headaches and dizziness. Consult ordered due to hemorrhagic tumors on Ct. Advised [facility]. She decompensated at [facility] and was intubated and [facility].   Spouse in waiting room - reports intermittent numnbess in hands, Ha, dizziness, but thought it was sinus and neuropathy related.  Most history gathered from chart review.   PMH: Past Medical History:  Diagnosis Date  . Depression   . Diabetes mellitus without complication (HCC)   . Hyperlipidemia     PSH: Past Surgical History:  Procedure Laterality Date  . CESAREAN SECTION  12/1996  . LAPAROSCOPY  01/1990   remove scar tissue  . OOPHORECTOMY  1991   rt  . TUBAL LIGATION  12/1996  . VULVA SURGERY  06/2007   remove precancerous cells    SH: Social History  Substance Use Topics  . Smoking status: Former Smoker    Packs/day: 0.50    Years: 22.00    Types: Cigarettes    Quit date: 12/16/2012  . Smokeless tobacco: Never Used  . Alcohol use No    MEDS: Prior to Admission medications   Medication Sig Start Date End Date Taking? Authorizing Provider  buPROPion (WELLBUTRIN XL) 300 MG 24 hr tablet Take 300 mg by mouth daily.    Historical Provider, MD  fenofibrate 160 MG tablet  03/29/13   Historical Provider, MD  rosuvastatin (CRESTOR) 20 MG tablet Take 20 mg by mouth daily.    Historical Provider, MD    ALLERGY: Allergies  Allergen Reactions  . 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 Largest area of hemorrhage measures 3 cm within the cerebellar vermis. No evidence of midline shift or  hydrocephalus.  IMPRESSION: 26- 51 y.o. female [facility] for [facility]. Found to have multiple hemorrhagic lesions. She decomp[facility] at [facility] and was intubated. Trans[facility] to [facility]. Pupils were no longer reactive. Did not respond to painful stimulus. EVD placed.   PLAN: - suboccipital craniectomy for evacuation of hemorrhagic cerebellar tumor. - Discussed with spouse risks and benefits of procedure including alternatives. He states, in his language, understanding of the procedure and risks. He wishes to proceed with the surgery - Scheduled now.

## 2016-04-04 NOTE — Procedures (Signed)
Intubation Procedure Note Crystal Jensen [id number] 12/26/1965  Procedure: Intubation Ind[facility]: [facility]  Procedure Details Consent: yes Time Out: Verified patient identification, verified procedure, site/side was marked, verified correct patient position, special equipment/implants available, medications/allergies/relevent history reviewed, required imaging and test results available. Performed at 1045  Maximum sterile technique was used including: gloves, equipment from sterile packaging    Evaluation Hemodynamic Status: stable 107/71, HR 81,  O2 sats: 100 Patient's Current Condition:stable  Complications: slightly difficult airway. Patient did tolerate procedure well. Chest X-ray ordered to verify placement.  yes   Crystal GaskinsJennifer D Sinthia Jensen 08-25-16

## 2016-04-04 NOTE — ED Triage Notes (Addendum)
Pt reports that while she was taking a shower and left ear and head began hurting. Currently complaining of severe headache and dizziness. Nauseated, no vomiting. Eyes deviated to right side

## 2016-04-04 NOTE — ED Notes (Signed)
Pt found to be unresponsive by primary nurse, B. Patraw RN.  Dr. Estell HarpinZammit called to bedside.  Pt having snoring respirations.  Nasal airway inserted by myself into left nare and BVM ventilation begun.  Preparing for intubation.  Pt was given Narcan by B. Patraw RN with no change.

## 2016-04-04 NOTE — ED Notes (Signed)
Called CT.  

## 2016-04-05 ENCOUNTER — Inpatient Hospital Stay (HOSPITAL_COMMUNITY): Payer: 59

## 2016-04-05 DIAGNOSIS — I614 Nontraumatic intracerebral hemorrhage in cerebellum: Secondary | ICD-10-CM

## 2016-04-05 LAB — BASIC METABOLIC PANEL
Anion gap: 9 (ref 5–15)
BUN: <5 mg/dL — ABNORMAL LOW (ref 6–20)
CHLORIDE: 114 mmol/L — AB (ref 101–111)
CO2: 23 mmol/L (ref 22–32)
CREATININE: 0.5 mg/dL (ref 0.44–1.00)
Calcium: 8.3 mg/dL — ABNORMAL LOW (ref 8.9–10.3)
GFR calc non Af Amer: >60 mL/min (ref >60)
GLUCOSE: 150 mg/dL — AB (ref 65–99)
Potassium: 3.4 mmol/L — ABNORMAL LOW (ref 3.5–5.1)
Sodium: 146 mmol/L — ABNORMAL HIGH (ref 135–145)

## 2016-04-05 LAB — GLUCOSE, CAPILLARY
Glucose-Capillary: 128 mg/dL — ABNORMAL HIGH (ref 65–99)
Glucose-Capillary: 129 mg/dL — ABNORMAL HIGH (ref 65–99)
Glucose-Capillary: 144 mg/dL — ABNORMAL HIGH (ref 65–99)
Glucose-Capillary: 166 mg/dL — ABNORMAL HIGH (ref 65–99)
Glucose-Capillary: 89 mg/dL (ref 65–99)

## 2016-04-05 LAB — MAGNESIUM: Magnesium: 1.7 mg/dL (ref 1.7–2.4)

## 2016-04-05 LAB — PHOSPHORUS: PHOSPHORUS: 2.8 mg/dL (ref 2.5–4.6)

## 2016-04-05 LAB — CBC
HEMATOCRIT: 40 % (ref 36.0–46.0)
HEMOGLOBIN: 13.4 g/dL (ref 12.0–15.0)
MCH: 28.1 pg (ref 26.0–34.0)
MCHC: 33.5 g/dL (ref 30.0–36.0)
MCV: 83.9 fL (ref 78.0–100.0)
Platelets: 308 10*3/uL (ref 150–400)
RBC: 4.77 MIL/uL (ref 3.87–5.11)
RDW: 15.1 % (ref 11.5–15.5)
WBC: 19.5 10*3/uL — ABNORMAL HIGH (ref 4.0–10.5)

## 2016-04-05 LAB — HIV ANTIBODY (ROUTINE TESTING W REFLEX): HIV Screen 4th Generation wRfx: NONREACTIVE

## 2016-04-05 MED ORDER — SODIUM CHLORIDE 0.9 % IV SOLN
3.0000 g | Freq: Three times a day (TID) | INTRAVENOUS | Status: DC
  Administered 2016-04-05: 3 g via INTRAVENOUS
  Filled 2016-04-05 (×3): qty 3

## 2016-04-05 MED ORDER — MORPHINE SULFATE (PF) 2 MG/ML IV SOLN: 2.0000 mg | INTRAVENOUS | Status: DC | PRN

## 2016-04-05 MED ORDER — SODIUM CHLORIDE 0.9 % IV SOLN
5.0000 mg/h | INTRAVENOUS | Status: DC
  Administered 2016-04-05: 5 mg/h via INTRAVENOUS
  Filled 2016-04-05: qty 10

## 2016-04-05 MED ORDER — POTASSIUM CHLORIDE CRYS ER 20 MEQ PO TBCR
40.0000 meq | EXTENDED_RELEASE_TABLET | Freq: Once | ORAL | Status: AC
  Administered 2016-04-05: 40 meq via ORAL
  Filled 2016-04-05: qty 2

## 2016-04-05 MED ORDER — ATROPINE SULFATE 1 MG/ML IJ SOLN: 1.0000 mg | Freq: Once | INTRAMUSCULAR | Status: DC

## 2016-04-06 ENCOUNTER — Encounter (HOSPITAL_COMMUNITY): Payer: Self-pay | Admitting: Neurological Surgery

## 2016-04-06 LAB — TYPE AND SCREEN
ABO/RH(D): O POS
ANTIBODY SCREEN: NEGATIVE
UNIT DIVISION: 0
UNIT DIVISION: 0
Unit division: 0
Unit division: 0

## 2016-04-06 MED FILL — Medication: Qty: 1 | Status: AC

## 2016-04-07 MED FILL — Medication: Qty: 1 | Status: AC

## 2016-04-09 NOTE — Progress Notes (Signed)
OT Cancellation Note  Patient Details Name: Crystal IdlerMaria Jensen MRN: [id number] DOB: 1965/02/11   Cancelled Treatment:    Reason Eval/Treat Not Completed: Patient not medically ready (pt not appropriate for theapies at this time; will sign off).  Gaye AlkenBailey A Tabita Corbo M.S., OTR/L Pager: [phone]  03/19/2016, 8:03 AM

## 2016-04-09 NOTE — Discharge Summary (Signed)
Date of Admission: Jun 18, 2016  Date of Discharge: 04/06/16  PRE-OPERATIVE DIAGNOSIS:  Hemorrhagic posterior fossa mass  POST-OPERATIVE DIAGNOSIS:  Same  PROCEDURE:  Suboccipital craniotomy and C1 laminectomy for evacuation of posterior fossa hemorrhagic mass; use of operating microscope  Attending: Cherrie DistanceBenjamin Ditty, MD  Hospital Course:  Patient presented to [facility] on Jun 18, 2016 for headaches and dizziness. Found to have hemorrhagic tumors. She decomp[facility] and was intubated and [facility]. She underwent immediate EVD due to fixed, dilated pupils and brought to the OR for emergent suboccipital craniotomy for evacuation of hemorrhagic cerebellar tumor.After surgery, she was transferred to ICU.  PCCM was consulted for assistance with management.  Yesterday, patient became hypotensive and bradycardic. Neurosurgery and PCCM were called to bedside. Husband and family decided to hold CPR or aggressive measures in the event of arrest. Ultimately family decided for palliative care and patient was extubated. 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

## 2016-04-09 NOTE — Progress Notes (Signed)
Called emergently to bedside for bradycardia & hypotension.  Family at bedside.  Given presenting injury / diagnosis, discussed with husband Deniece Portela(Wayne), sister & sons regarding end of life / near cardiac arrest.  Husband states she would not want CPR or aggressive measures in the event of arrest.  Plan to continue current level of support but no CPR / escalation of care in the event of arrest.   Canary BrimBrandi Ollis, NP-C [facility] Pgr: [phone] or if no answer [phone] 03/27/2016, 2:56 PM

## 2016-04-09 NOTE — Progress Notes (Signed)
Chaplain responded to call for support during sudden decline of patient.  Provided pastoral support and prayer to family. Family also accepted a blessing with anointing.  They are formerly [employer], but not practicing;  During the crisis, however, they were very receptive to prayer.  Sisters, WinchesterPaula and ArbutusRegina, were present, two twin sons (approximately 120, Melanee Spryan and Sharia ReeveJosh) and brother Reita ClicheBobby showed up. Husband Deniece PortelaWayne and final son (oldest) Malachy MoodJarett arrived.  One other brother is in [place].  Family is actively grieving.  Offered follow-up care and ongoing prayers. Please call as needed or requested.     Theodoro Parmaalacios, Tiah Heckel N, Chaplain [phone]    03/24/2016 1500  Clinical Encounter Type  Visited With Patient and family together  Visit Type Initial;Patient actively dying  Referral From Physician  Consult/Referral To Chaplain  Stress Factors  Patient Stress Factors Health changes  Family Stress Factors Loss of control

## 2016-04-09 NOTE — Progress Notes (Signed)
Pt seen and examined. No issues overnight.  EXAM: Temp:  [93.2 F (34 C)-99.5 F (37.5 C)] 97.7 F (36.5 C) (02/25 1000) Pulse Rate:  [50-98] 79 (02/25 1000) Resp:  [13-22] 16 (02/25 1000) BP: (73-231)/(49-117) 166/76 (02/25 1000) SpO2:  [95 %-100 %] 99 % (02/25 1000) Arterial Line BP: (125-210)/(61-94) 210/75 (02/25 1000) FiO2 (%):  [40 %-100 %] 40 % (02/25 0838) Weight:  [60.7 kg (133 lb 13.1 oz)-62.1 kg (136 lb 14.5 oz)] 60.7 kg (133 lb 13.1 oz) (02/25 0448) Intake/Output      02/24 0701 - 02/25 0700 02/25 0701 - 02/26 0700   I.V. (mL/kg) 3071.5 (50.6) 300 (4.9)   Blood 670    IV Piggyback 200    Total Intake(mL/kg) 3941.5 (64.9) 300 (4.9)   Urine (mL/kg/hr) 4525 560 (2.8)   Drains 69 9 (0)   Blood 450    Total Output 5044 569   Net -1102.5 -269         Intubated Unresponsive Pupils remain fixed and dilated Withdraws to pain in bilateral lower extremities  CT Head shows satisfactory evacuation of posterior fossa hematoma, IVH probably refluxed from fourth ventricle  Not meaningfully improved Continue supportive care I expect a poor prognosis

## 2016-04-09 NOTE — Progress Notes (Signed)
Wasted 245ml of morphine with [place]ittany Rn.

## 2016-04-09 NOTE — Consult Note (Signed)
PULMONARY / CRITICAL CARE MEDICINE   Name: Crystal Jensen MRN: [id number] DOB: 09-16-1965    ADMISSION DATE:  04-09-2016 CONSULTATION DATE:  2016/04/09  REFERRING MD:  Dr. Bevely Palmer   CHIEF COMPLAINT:  Altered mental status  HISTORY OF PRESENT ILLNESS:  51 y/o F who presented to [facility] on 2/24 with acute onset severe headache, dizziness, ear pain and dizziness.  Work up concerning for multiple hemorrhagic lesions / left hemorrhagic posterior fossa mass.  Transferred to [facility] for neurosurgical evaluation.  She underwent emergent EVD placement on arrival and then to OR for sub-occiptial craniotomy for evacuation of tumor.   Returned to ICU post-operatively.   SUBJECTIVE:  EVD drain raised to 10 cm overnight.  No acute events.    VITAL SIGNS: BP 138/73   Pulse 85   Temp 97.5 F (36.4 C)   Resp 16   Ht 5\' 3"  (1.6 m)   Wt 133 lb 13.1 oz (60.7 kg)   SpO2 99%   BMI 23.71 kg/m   HEMODYNAMICS:    VENTILATOR SETTINGS: Vent Mode: PRVC FiO2 (%):  [40 %-100 %] 40 % Set Rate:  [16 bmp] 16 bmp Vt Set:  [420 mL-450 mL] 420 mL PEEP:  [5 cmH20] 5 cmH20 Plateau Pressure:  [16 cmH20-19 cmH20] 17 cmH20  INTAKE / OUTPUT: I/O last 3 completed shifts: In: 3941.5 [I.V.:3071.5; Blood:670; IV Piggyback:200] Out: 5044 [Urine:4525; Drains:69; Blood:450]  PHYSICAL EXAMINATION: General:  Adult female in NAD on vent  HEENT: MM pink/moist, ETT Neuro: sedate on vent CV: s1s2 rrr, no m/r/g PULM: even/non-labored, lungs bilaterally clear OZ:HYQM, non-tender, bsx4 active  Extremities: warm/dry, no edema  Skin: no rashes or lesions   LABS:  BMET  Recent Labs Lab 2016-04-09 0953 04-09-2016 1001  04/09/2016 1634 Apr 09, 2016 1641 04/08/2016 0500  NA 139 142  < > 142 141 146*  K 3.4* 3.5  < > 4.1 4.1 3.4*  CL 109 108  --   --   --  114*  CO2 20*  --   --   --   --  23  BUN 10 9  --   --   --  <5*  CREATININE 0.56 0.40*  --   --   --  0.50  GLUCOSE 146* 145*  --   --  123* 150*  < > = values in this interval  not displayed.  Electrolytes  Recent Labs Lab 04-09-2016 0953 03/28/2016 0500  CALCIUM 9.8 8.3*  MG  --  1.7  PHOS  --  2.8    CBC  Recent Labs Lab 2016-04-09 0953  09-Apr-2016 1634 04/09/16 1641 04/03/2016 0500  WBC 14.2*  --   --   --  19.5*  HGB 12.6  < > 9.9* 9.2* 13.4  HCT 36.8  < > 29.0* 27.0* 40.0  PLT 411*  --   --   --  308  < > = values in this interval not displayed.  Coag's  Recent Labs Lab 04/09/16 0953  APTT 34  INR 0.88    Sepsis Markers No results for input(s): LATICACIDVEN, PROCALCITON, O2SATVEN in the last 168 hours.  ABG  Recent Labs Lab 2016-04-09 1506 2016-04-09 1634 2016/04/09 1855  PHART 7.369 7.352 7.289*  PCO2ART 35.0 29.6* 40.5  PO2ART 467.0* 245.0* 310*    Liver Enzymes  Recent Labs Lab 2016-04-09 0953  AST 23  ALT 17  ALKPHOS 86  BILITOT 0.3  ALBUMIN 4.0    Cardiac Enzymes No results for input(s): TROPONINI, PROBNP in the  last 168 hours.  Glucose  Recent Labs Lab 04/03/2016 1838 03/27/2016 2001 2016-04-28 0025 04/28/16 0427 April 28, 2016 0850 04-28-2016 1159  GLUCAP 129* 146* 166* 144* 129* 128*    Imaging Ct Head Wo Contrast  Result Date: 04/28/16 CLINICAL DATA:  Resection of posterior fossa hemorrhagic mass. EXAM: CT HEAD WITHOUT CONTRAST TECHNIQUE: Contiguous axial images were obtained from the base of the skull through the vertex without intravenous contrast. COMPARISON:  CT head without contrast 03/30/2016. FINDINGS: Brain: The hemorrhagic mass located in the vermis has been resected via suboccipital craniotomy. Gas and blood products are evident within the surgical cavity. Residual hyperdensity is noted along the superior aspect of the surgical cavity likely representing blood products or residual mass lesion. There is persistent mass effect. The cerebellar tonsils extend below the foramen magnum. The basal cisterns and sulci are effaced. Fourth ventricle is not visualized. A right frontal ventriculostomy catheter is in place. The  tip is at the third ventricle. Intraventricular hemorrhage is new with blood fluid levels in the posterior horns. Hemorrhage is present in the anterior lateral ventricles and third ventricle as well. Multiple other hyperdense lesions are again seen throughout both hemispheres, likely reflecting hemorrhagic metastases. There is further effacement of the sulci suggesting increasing intracranial mass effect. Vascular: None. Skull: Suboccipital craniotomy and right frontal burr hole. The calvarium is otherwise intact. Sinuses/Orbits: The paranasal sinuses and mastoid air cells are clear. The globes and orbits are within normal limits. IMPRESSION: 1. Interval resection of hemorrhagic mass lesion from the posterior fossa. 2. Increasing intracranial mass effect with effacement of the sulci, effacement of the basal cisterns, and progressive downward herniation of the cerebellar tonsils. 3. Gas and blood products within the surgical cavity as expected. 4. New intraventricular hemorrhage with right frontal ventriculostomy catheter in place. 5. Multiple hyperdense lesions bilaterally likely reflect other hemorrhagic metastases. Electronically Signed   By: Marin Roberts M.D.   On: 04/28/2016 07:44   Dg Chest Port 1 View  Result Date: April 28, 2016 CLINICAL DATA:  Acute respiratory failure. Intracranial hemorrhage. Hemorrhagic brain metastases. EXAM: PORTABLE CHEST 1 VIEW COMPARISON:  One-view chest x-ray 03/12/2016. FINDINGS: The heart size is normal. The endotracheal tube is stable. The NG tube courses off the inferior border of the film. Right middle lobe and lingular airspace disease remains. Mild diffuse interstitial prominence is stable. IMPRESSION: 1. Persistent right middle lobe and lingular airspace disease. This remains concerning for infection or aspiration. 2. The support apparatus are stable. Electronically Signed   By: Marin Roberts M.D.   On: 04/28/2016 08:17   Dg Chest Port 1 View  Result Date:  03/28/2016 CLINICAL DATA:  Endotracheal tube adjustment. EXAM: PORTABLE CHEST 1 VIEW COMPARISON:  04/08/2016 at 1746 hours FINDINGS: Endotracheal tube tip now projects 1.4 cm above the carina, well positioned. Nasal/ orogastric tube remains well positioned passing below the diaphragm into the stomach. Lungs show hazy medial base opacity, similar to the earlier study. On the right, there is now evidence of air bronchograms. This opacity may be atelectasis. Consider pneumonia in the proper clinical setting. Remainder of the lungs is clear. IMPRESSION: 1. Endotracheal tube has been retracted and is now well positioned, tip projecting 1.4 cm above the carina. 2. Persistent medial lung base opacity. This may reflect atelectasis, pneumonia or a combination. Electronically Signed   By: Amie Portland M.D.   On: 03/28/2016 20:06   Dg Chest Port 1 View  Result Date: 04/04/2016 CLINICAL DATA:  Encounter for intubation. Hx of diabetes. No surgery.  Former smoker. EXAM: PORTABLE CHEST 1 VIEW COMPARISON:  03/05/2006 FINDINGS: The endotracheal tube tip projects the right mainstem bronchus. This will need to be retracted approximately 3 cm for optimal positioning. The nasal/orogastric tube passes well below the diaphragm into the stomach. Heart, mediastinum and hila are unremarkable. Mild atelectasis noted at the medial lung bases. Lungs are otherwise clear. No convincing pleural effusion. No pneumothorax. Skeletal structures are intact. IMPRESSION: 1. Endotracheal tube tip projects the right mainstem bronchus. Critical Value/emergent results were called by telephone at the time of interpretation on 08-11-2016 at 6:17 pm to [facility], the patient's nurse, who verbally acknowledged these results. 2. Well-positioned nasal/orogastric tube. 3. No acute cardiopulmonary disease. Electronically Signed   By: Amie Portlandavid  Ormond M.D.   On: 007-04-2016 18:18   Dg Abd Portable 1v  Result Date: 08-11-2016 CLINICAL DATA:  Enteric tube placement.  EXAM: PORTABLE ABDOMEN - 1 VIEW COMPARISON:  None. FINDINGS: Enteric tube is present coiled once over the stomach with tip in the left mid abdomen likely over the distal stomach. Bowel gas pattern is nonobstructive with several air-filled nondilated small bowel loops. Air and stool are present throughout the colon. No free peritoneal air. Minimal degenerative change of the spine. IMPRESSION: Nonspecific, nonobstructive bowel gas pattern. Enteric tube coiled once over the stomach with tip in the left mid abdomen likely over the distal stomach. Electronically Signed   By: Elberta Fortisaniel  Boyle M.D.   On: 007-04-2016 14:09     STUDIES:  CT Head 2/24 >> multiple hemorrhagic lesions within both cerebral hemispheres and cerebellum concerning for hemorrhagic metastases, largest area measuring 3cm without evidence of midline shift or hydrocephalus.  CULTURES: HIV 2/24 >>   ANTIBIOTICS: Unasyn 2/25 >>  SIGNIFICANT EVENTS: 2/24  Admitted with acute onset ear pain, dizziness.  CT head > hemorrhagic lesions concerning for mets.  S/p EVD  LINES/TUBES: EVD 2/24 >>   DISCUSSION: 51 y/o F admitted with acute onset ear pain, dizziness, deviated eyes in ER.  Found unresponsive, CT head with multiple hemorrhagic lesions concerning for metastasis.  Tx [facility].  EVD drain placed on arrival.    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  Hypothermia Depression P:   RASS Goal: 0 to -1  Defer mgmt to NSGY Serial neuro exams EVD care per protocol  Anticipate poor prognosis   PULMONARY A: Acute Respiratory Failure in setting of ICH RML, RLL Airspace Disease - right mainstem intubation initially, possible aspiration + atelectasis Tobacco Abuse  P:   PRVC 8 cc/kg  Wean PEEP / FiO2 for sats > 90% Monitor CXR  Begin Unasyn for suspected aspiration  PRN albuterol  PRN ABG  CARDIOVASCULAR A:  Hypertension - acute, no hx of  Hx HLD  P:  SBP Goal <160  Cardene gtt for  above goal as needed  ICU monitoring  Hold home cholesterol agents for now  RENAL A:   Hypokalemia  P:   Trend BMP / UOP Replace electrolytes as indicated   GASTROINTESTINAL A:   No acute issues  P:   NPO  Pepcid BID for SUP  OGT   HEMATOLOGIC A:   Mild Leukocytosis - likely stress response  Concern for Underlying Malignancy - see neuro  P:  Trend CBC No anticoagulation with ICH  SCD's for DVT prophylaxis  Consider CT chest, abd/pelvis to assess for underlying primary site  INFECTIOUS A:   Aspiration PNA RML/RLL P:   Monitor EVD site, fever curve, WBC  trend Unasyn, D1/x   ENDOCRINE A:   Hyperglycemia DM II  P:   SSI   FAMILY  - Updates:  Family updated at bedside on plan of care.      - Inter-disciplinary family meet or Palliative Care meeting due by:  Ongoing   CC Time: 30 minutes   Canary Brim, NP-C [facility] Pgr: [phone] or if no answer [phone] 03/16/2016, 12:26 PM

## 2016-04-09 NOTE — Progress Notes (Signed)
PT Cancellation Note  Patient Details Name: Crystal IdlerMaria Jensen MRN: [id number] DOB: Jun 14, 1965   Cancelled Treatment:    Reason Eval/Treat Not Completed: Patient not medically ready, no appropriate for therapies will sign off.   Fabio AsaDevon J Xuan Mateus 10/07/16, 8:01 AM

## 2016-04-09 NOTE — Progress Notes (Signed)
Pt. Terminally Extubated per order. RN and family at bedside.

## 2016-04-09 NOTE — Progress Notes (Signed)
SLP Cancellation Note  Patient Details Name: Crystal IdlerMaria Jensen MRN: [id number] DOB: [birth year]   Cancelled treatment:       Reason Eval/Treat Not Completed: Medical issues which prohibited therapy. Still intubated.   [place] Kungleksiak, Amy K, MA, CCC-SLP 04-21-16, 10:29 AM [phone]

## 2016-04-09 NOTE — Progress Notes (Signed)
eLink Physician-Brief Progress Note Patient Name: Crystal IdlerMaria Burtch DOB: 1965/10/02 MRN: [id number]   Date of Service  04/07/2016  HPI/Events of Note  Decision has been made to shift to comfort care  eICU Interventions  Morphine infusion/ extubation ordered      Intervention Category Major Interventions: Respiratory failure - evaluation and management  Sandrea HughsMichael Tamon Parkerson 03/17/2016, 6:38 PM

## 2016-04-09 DEATH — deceased

## 2018-09-28 IMAGING — CR DG CHEST 1V PORT
1 series · 1 of 1 positions shown · non-contrast
Comparison: 04/04/2016 at 1070 hours

CLINICAL DATA: Endotracheal tube adjustment.

EXAM:
PORTABLE CHEST 1 VIEW

[AP]
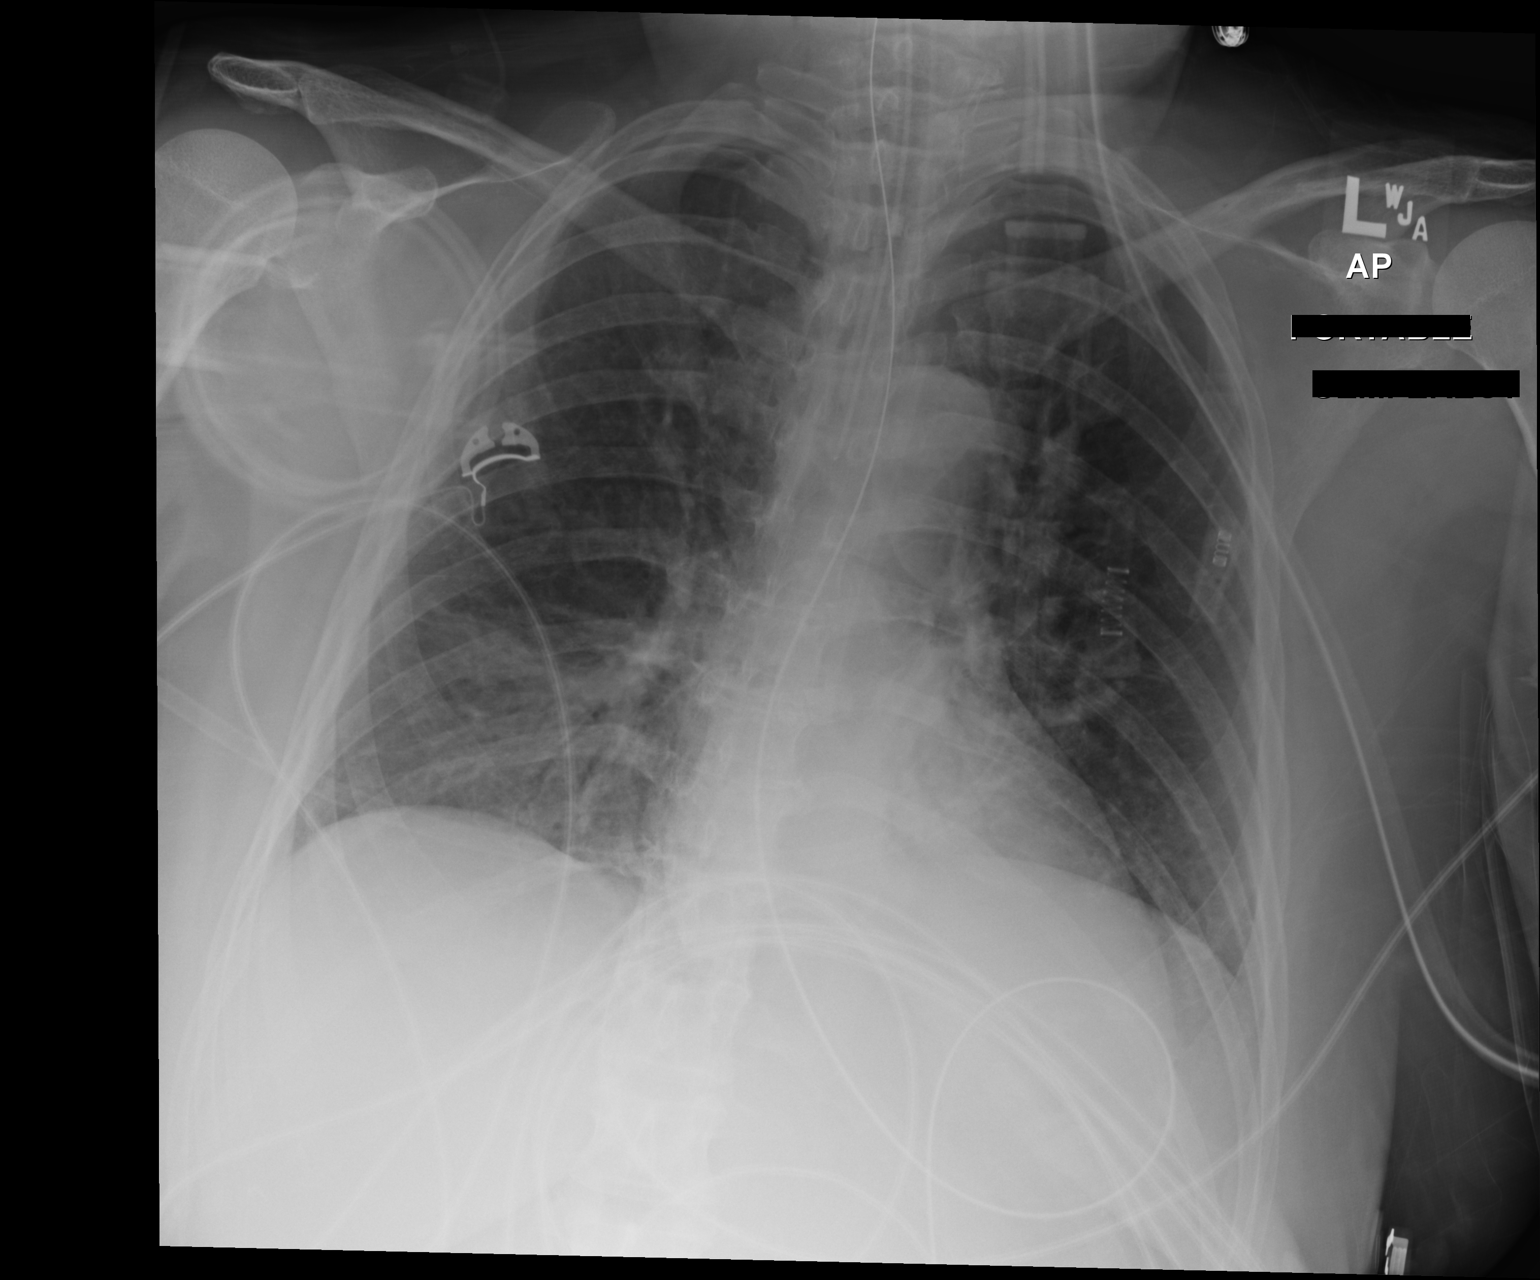

[1 of 1 positions shown; findings below may reference images not displayed]

FINDINGS: Endotracheal tube tip now projects 1.4 cm above the carina, well
positioned.

Nasal/ orogastric tube remains well positioned passing below the
diaphragm into the stomach.

Lungs show hazy medial base opacity, similar to the earlier study.
On the right, there is now evidence of air bronchograms. This
opacity may be atelectasis. Consider pneumonia in the proper
clinical setting. Remainder of the lungs is clear.
IMPRESSION: 1. Endotracheal tube has been retracted and is now well positioned,
tip projecting 1.4 cm above the carina.
2. Persistent medial lung base opacity. This may reflect
atelectasis, pneumonia or a combination.

## 2018-09-29 IMAGING — DX DG CHEST 1V PORT
1 series · 1 of 1 positions shown · non-contrast
Comparison: One-view chest x-ray 04/04/2016.

CLINICAL DATA: Acute respiratory failure. Intracranial hemorrhage.
Hemorrhagic brain metastases.

EXAM:
PORTABLE CHEST 1 VIEW

[chest ap]
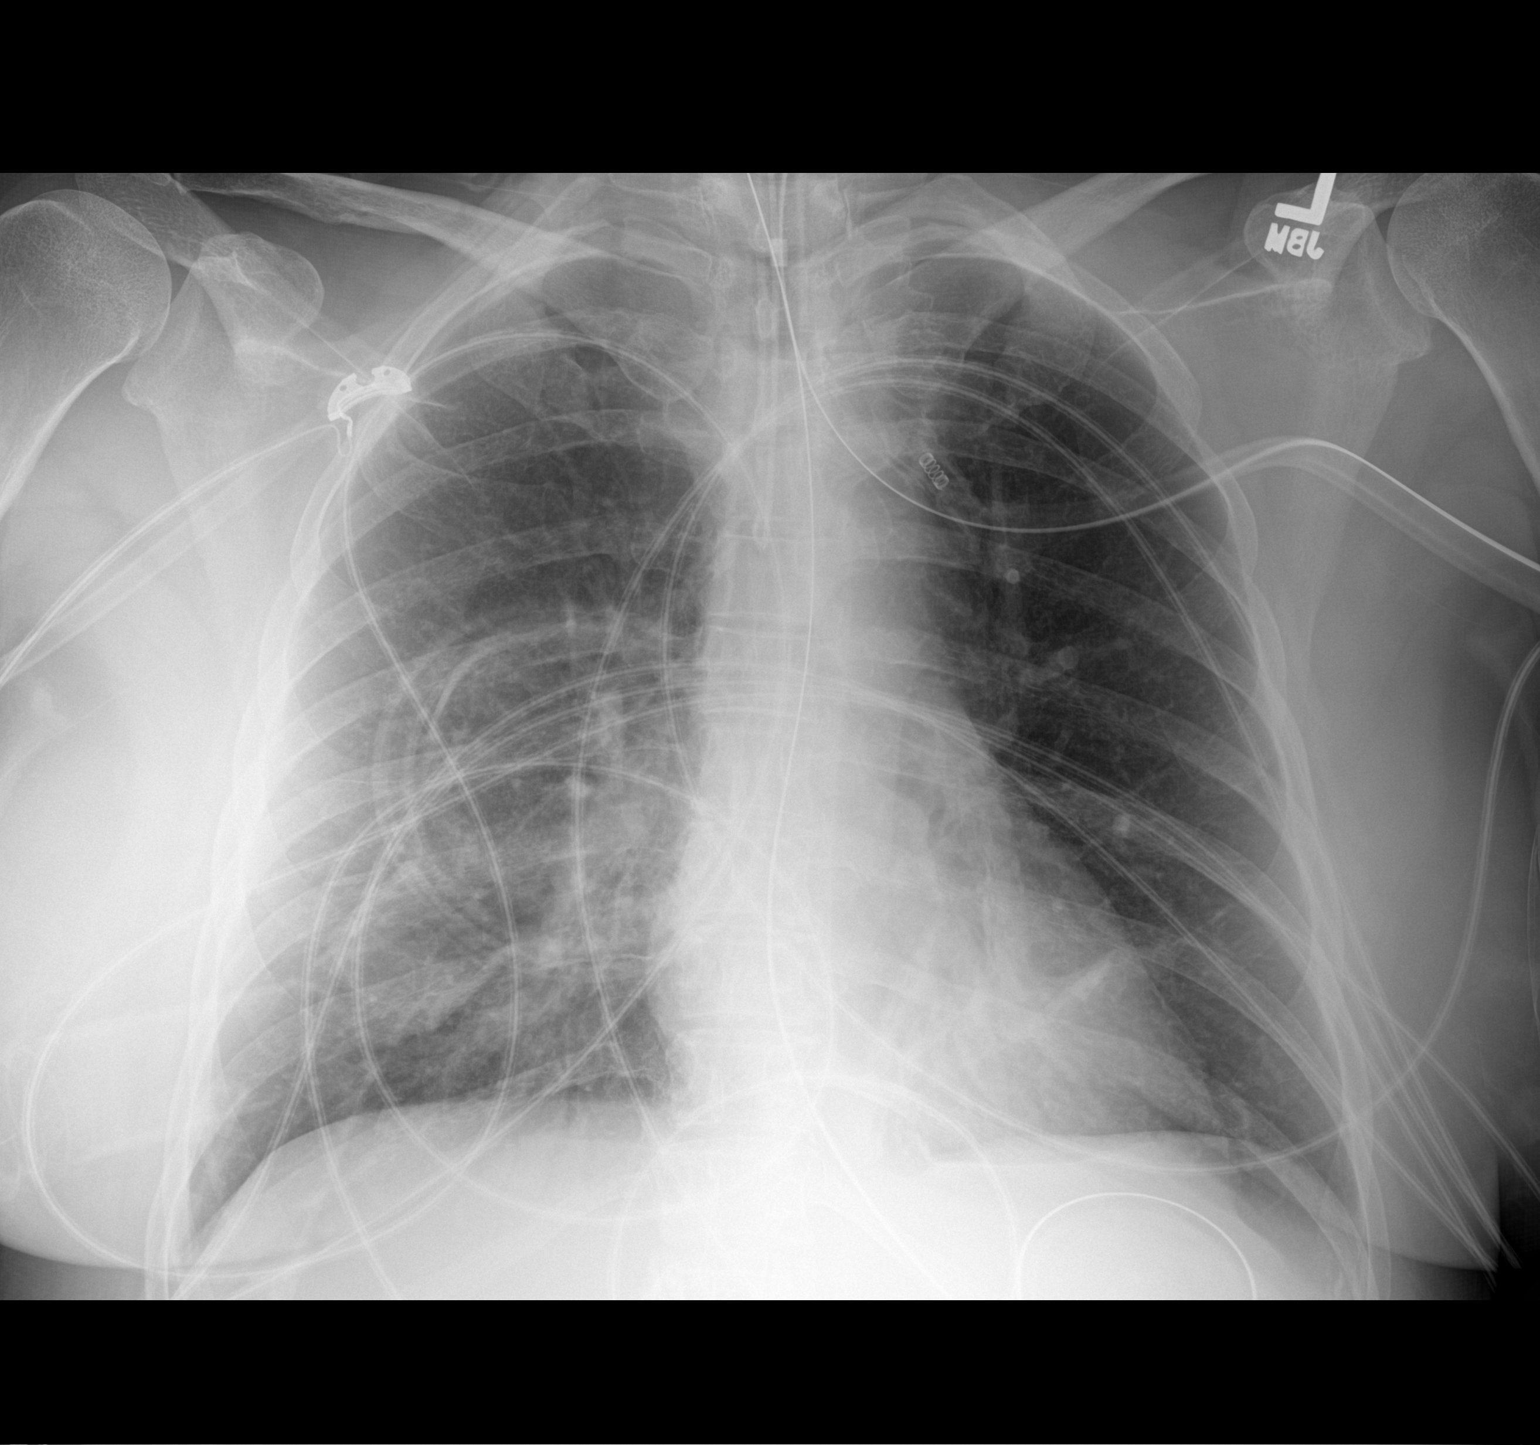

[1 of 1 positions shown; findings below may reference images not displayed]

FINDINGS: The heart size is normal. The endotracheal tube is stable. The NG
tube courses off the inferior border of the film. Right middle lobe
and lingular airspace disease remains. Mild diffuse interstitial
prominence is stable.
IMPRESSION: 1. Persistent right middle lobe and lingular airspace disease. This
remains concerning for infection or aspiration.
2. The support apparatus are stable.
